# Patient Record
Sex: Male | Born: 1980 | Race: Black or African American | Hispanic: No | Marital: Single | Smoking: Current every day smoker
Health system: Southern US, Community
[De-identification: ages and names within clinical notes are randomized; demographics above are authoritative.]

## PROBLEM LIST (undated history)

## (undated) DIAGNOSIS — G43909 Migraine, unspecified, not intractable, without status migrainosus: Secondary | ICD-10-CM

## (undated) DIAGNOSIS — I10 Essential (primary) hypertension: Secondary | ICD-10-CM

## (undated) DIAGNOSIS — J45909 Unspecified asthma, uncomplicated: Secondary | ICD-10-CM

## (undated) DIAGNOSIS — Z87442 Personal history of urinary calculi: Secondary | ICD-10-CM

## (undated) DIAGNOSIS — F419 Anxiety disorder, unspecified: Secondary | ICD-10-CM

## (undated) DIAGNOSIS — Z605 Target of (perceived) adverse discrimination and persecution: Secondary | ICD-10-CM

---

## 2012-08-16 ENCOUNTER — Encounter (HOSPITAL_COMMUNITY): Payer: Self-pay | Admitting: *Deleted

## 2012-08-16 ENCOUNTER — Emergency Department (HOSPITAL_COMMUNITY)
Admission: EM | Admit: 2012-08-16 | Discharge: 2012-08-17 | Discharge status: Against Medical Advice | Payer: PRIVATE HEALTH INSURANCE | Attending: Emergency Medicine | Admitting: Emergency Medicine

## 2012-08-16 DIAGNOSIS — I10 Essential (primary) hypertension: Secondary | ICD-10-CM | POA: Insufficient documentation

## 2012-08-16 DIAGNOSIS — Z87828 Personal history of other (healed) physical injury and trauma: Secondary | ICD-10-CM | POA: Insufficient documentation

## 2012-08-16 DIAGNOSIS — M545 Low back pain, unspecified: Secondary | ICD-10-CM | POA: Insufficient documentation

## 2012-08-16 DIAGNOSIS — F17200 Nicotine dependence, unspecified, uncomplicated: Secondary | ICD-10-CM | POA: Insufficient documentation

## 2012-08-16 HISTORY — DX: Essential (primary) hypertension: I10

## 2012-08-16 HISTORY — DX: Anxiety disorder, unspecified: F41.9

## 2012-08-16 NOTE — ED Notes (Signed)
Pt called twice but no answer,  This writer also went outside to see if pt available

## 2012-08-16 NOTE — ED Notes (Signed)
Pt not in [facility] when called
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
## 2012-08-31 ENCOUNTER — Emergency Department (HOSPITAL_COMMUNITY)
Admission: EM | Admit: 2012-08-31 | Discharge: 2012-09-01 | Disposition: A | Discharge status: Home / Self Care | Payer: No Typology Code available for payment source | Attending: Emergency Medicine | Admitting: Emergency Medicine

## 2012-08-31 ENCOUNTER — Encounter (HOSPITAL_COMMUNITY): Payer: Self-pay | Admitting: Emergency Medicine

## 2012-08-31 DIAGNOSIS — R112 Nausea with vomiting, unspecified: Secondary | ICD-10-CM | POA: Insufficient documentation

## 2012-08-31 DIAGNOSIS — R42 Dizziness and giddiness: Secondary | ICD-10-CM | POA: Insufficient documentation

## 2012-08-31 DIAGNOSIS — F17200 Nicotine dependence, unspecified, uncomplicated: Secondary | ICD-10-CM | POA: Insufficient documentation

## 2012-08-31 DIAGNOSIS — G43909 Migraine, unspecified, not intractable, without status migrainosus: Secondary | ICD-10-CM | POA: Insufficient documentation

## 2012-08-31 DIAGNOSIS — Z880 Allergy status to penicillin: Secondary | ICD-10-CM | POA: Insufficient documentation

## 2012-08-31 DIAGNOSIS — I10 Essential (primary) hypertension: Secondary | ICD-10-CM | POA: Insufficient documentation

## 2012-08-31 DIAGNOSIS — H53149 Visual discomfort, unspecified: Secondary | ICD-10-CM | POA: Insufficient documentation

## 2012-08-31 DIAGNOSIS — Z8659 Personal history of other mental and behavioral disorders: Secondary | ICD-10-CM | POA: Insufficient documentation

## 2012-08-31 HISTORY — DX: Migraine, unspecified, not intractable, without status migrainosus: G43.909

## 2012-08-31 MED ORDER — KETOROLAC TROMETHAMINE 60 MG/2ML IM SOLN
60.0000 mg | Freq: Once | INTRAMUSCULAR | Status: AC
  Administered 2012-08-31: 60 mg via INTRAMUSCULAR
  Filled 2012-08-31: qty 2

## 2012-08-31 MED ORDER — HYDROMORPHONE HCL PF 1 MG/ML IJ SOLN
1.0000 mg | Freq: Once | INTRAMUSCULAR | Status: AC
  Administered 2012-08-31: 1 mg via INTRAMUSCULAR
  Filled 2012-08-31: qty 1

## 2012-08-31 MED ORDER — METOCLOPRAMIDE HCL 5 MG/ML IJ SOLN
10.0000 mg | Freq: Once | INTRAMUSCULAR | Status: AC
  Administered 2012-08-31: 10 mg via INTRAMUSCULAR
  Filled 2012-08-31: qty 2

## 2012-08-31 MED ORDER — ONDANSETRON 8 MG PO TBDP
8.0000 mg | ORAL_TABLET | Freq: Once | ORAL | Status: AC
  Administered 2012-08-31: 8 mg via ORAL
  Filled 2012-08-31: qty 1

## 2012-08-31 NOTE — ED Notes (Signed)
Pt c/o HA, dizziness, nausea, emesis x 6-7 today.

## 2012-08-31 NOTE — ED Notes (Signed)
Pt states he has been nausea, vomiting and diarrhea,  Pt is alert and oriented in NAD

## 2012-09-01 MED ORDER — ONDANSETRON 8 MG PO TBDP: 8.0000 mg | ORAL_TABLET | Freq: Three times a day (TID) | ORAL | Status: DC | PRN

## 2012-09-01 MED ORDER — NAPROXEN SODIUM 220 MG PO TABS: 220.0000 mg | ORAL_TABLET | Freq: Two times a day (BID) | ORAL | Status: DC

## 2012-09-01 NOTE — ED Provider Notes (Signed)
History    CSN: [id number] Arrival date & time 08/31/12  2230  First MD Initiated Contact with Patient 08/31/12 2259     Chief Complaint  Patient presents with  . Headache  . Dizziness    The history is provided by the patient.   patient reports a long-standing history of migraine headaches.  He reports for the past 6-7 days he's had frontal headache with photophobia and phonophobia.  He states this feels like his prior migraine headaches.  He tried over-the-counter pain medications without improvement in his symptoms.  She's had nausea and vomiting.  Denies change in his vision.  No weakness of his arms or legs.  No chest pain or shortness of breath.  No abdominal pain.  No other complaints.  He continues to smoke cigarettes.     Past Medical History  Diagnosis Date  . Anxiety   . Hypertension   . Migraine    History reviewed. No pertinent past surgical history. No family history on file. History  Substance Use Topics  . Smoking status: Current Every Day Smoker    Types: Cigarettes  . Smokeless tobacco: Not on file  . Alcohol Use: Yes     Comment: weekends    Review of Systems  Neurological: Positive for headaches.  All other systems reviewed and are negative.    Allergies  Penicillins  Home Medications   Current Outpatient Rx  Name  Route  Sig  Dispense  Refill  . Biotin 5000 MCG CAPS   Oral   Take 5,000 mcg by mouth daily.         . naproxen sodium (ANAPROX) 220 MG tablet   Oral   Take 220 mg by mouth 2 (two) times daily as needed. Head ache .         . naproxen sodium (ALEVE) 220 MG tablet   Oral   Take 1 tablet (220 mg total) by mouth 2 (two) times daily with a meal.   10 tablet   0   . ondansetron (ZOFRAN ODT) 8 MG disintegrating tablet   Oral   Take 1 tablet (8 mg total) by mouth every 8 (eight) hours as needed for nausea.   12 tablet   0    BP 151/72  Pulse 78  Temp(Src) 98.9 F (37.2 C) (Oral)  Resp 16  Ht 6' (1.829 m)  Wt 435 lb  (197.315 kg)  BMI 58.98 kg/m2  SpO2 97% Physical Exam  Nursing note and vitals reviewed. Constitutional: He is oriented to person, place, and time. He appears well-developed and well-nourished.  HENT:  Head: Normocephalic and atraumatic.  Eyes: EOM are normal. Pupils are equal, round, and reactive to light.  Neck: Normal range of motion.  Cardiovascular: Normal rate, regular rhythm, normal heart sounds and intact distal pulses.   Pulmonary/Chest: Effort normal and breath sounds normal. No respiratory distress.  Abdominal: Soft. He exhibits no distension. There is no tenderness.  Genitourinary: Rectum normal.  Musculoskeletal: Normal range of motion.  Neurological: He is alert and oriented to person, place, and time.  5/5 strength in major muscle groups of  bilateral upper and lower extremities. Speech normal. No facial asymetry.   Skin: Skin is warm and dry.  Psychiatric: He has a normal mood and affect. Judgment normal.    ED Course  Procedures (including critical care time) Labs Reviewed - No data to display No results found. 1. Migraine     MDM  Typical migraine headache for the pt.  Non focal neuro exam. No recent head trauma. No fever. Doubt meningitis. Doubt intracranial bleed. Doubt normal pressure hydrocephalus. No indication for imaging. Will treat with migraine cocktail and reevaluate  12:46 AM Feels better. Dc home  Lyanne Co, MD 09/01/12 [phone]

## 2012-12-08 ENCOUNTER — Emergency Department (HOSPITAL_COMMUNITY): Payer: Self-pay | Admitting: Anesthesiology

## 2012-12-08 ENCOUNTER — Observation Stay (HOSPITAL_COMMUNITY)
Admission: EM | Admit: 2012-12-08 | Discharge: 2012-12-09 | Disposition: A | Discharge status: Home / Self Care | Payer: Self-pay | Attending: Urology | Admitting: Urology

## 2012-12-08 ENCOUNTER — Observation Stay (HOSPITAL_COMMUNITY): Payer: PRIVATE HEALTH INSURANCE

## 2012-12-08 ENCOUNTER — Emergency Department (HOSPITAL_COMMUNITY): Payer: Self-pay

## 2012-12-08 ENCOUNTER — Encounter (HOSPITAL_COMMUNITY): Payer: Self-pay | Admitting: *Deleted

## 2012-12-08 ENCOUNTER — Encounter (HOSPITAL_COMMUNITY)
Admission: EM | Disposition: A | Discharge status: Home / Self Care | Payer: Self-pay | Source: Home / Self Care | Attending: Emergency Medicine

## 2012-12-08 ENCOUNTER — Encounter (HOSPITAL_COMMUNITY): Payer: Self-pay | Admitting: Anesthesiology

## 2012-12-08 DIAGNOSIS — N132 Hydronephrosis with renal and ureteral calculous obstruction: Secondary | ICD-10-CM

## 2012-12-08 DIAGNOSIS — N1330 Unspecified hydronephrosis: Secondary | ICD-10-CM | POA: Insufficient documentation

## 2012-12-08 DIAGNOSIS — I10 Essential (primary) hypertension: Secondary | ICD-10-CM | POA: Insufficient documentation

## 2012-12-08 DIAGNOSIS — N200 Calculus of kidney: Secondary | ICD-10-CM | POA: Insufficient documentation

## 2012-12-08 DIAGNOSIS — N201 Calculus of ureter: Principal | ICD-10-CM | POA: Insufficient documentation

## 2012-12-08 HISTORY — PX: CYSTOSCOPY W/ URETERAL STENT PLACEMENT: SHX1429

## 2012-12-08 LAB — URINALYSIS, ROUTINE W REFLEX MICROSCOPIC
Bilirubin Urine: NEGATIVE
Nitrite: NEGATIVE
Specific Gravity, Urine: 1.029 (ref 1.005–1.030)
Urobilinogen, UA: 1 mg/dL (ref 0.0–1.0)

## 2012-12-08 LAB — CBC WITH DIFFERENTIAL/PLATELET
Eosinophils Absolute: 0.1 10*3/uL (ref 0.0–0.7)
Eosinophils Relative: 2 % (ref 0–5)
Hemoglobin: 15.3 g/dL (ref 13.0–17.0)
Lymphs Abs: 0.9 10*3/uL (ref 0.7–4.0)
MCH: 28.4 pg (ref 26.0–34.0)
MCHC: 33.6 g/dL (ref 30.0–36.0)
MCV: 84.4 fL (ref 78.0–100.0)
Monocytes Absolute: 0.4 10*3/uL (ref 0.1–1.0)
Monocytes Relative: 9 % (ref 3–12)
RBC: 5.39 MIL/uL (ref 4.22–5.81)

## 2012-12-08 LAB — COMPREHENSIVE METABOLIC PANEL
Alkaline Phosphatase: 55 U/L (ref 39–117)
BUN: 8 mg/dL (ref 6–23)
Calcium: 9.6 mg/dL (ref 8.4–10.5)
Creatinine, Ser: 1.13 mg/dL (ref 0.50–1.35)
GFR calc Af Amer: >90 mL/min (ref >90)
Glucose, Bld: 112 mg/dL — ABNORMAL HIGH (ref 70–99)
Total Protein: 7.7 g/dL (ref 6.0–8.3)

## 2012-12-08 LAB — LIPASE, BLOOD: Lipase: 19 U/L (ref 11–59)

## 2012-12-08 LAB — URINE MICROSCOPIC-ADD ON

## 2012-12-08 SURGERY — CYSTOSCOPY, WITH RETROGRADE PYELOGRAM AND URETERAL STENT INSERTION
Anesthesia: General | Site: Ureter | Laterality: Bilateral | Wound class: Clean Contaminated

## 2012-12-08 MED ORDER — HYDROMORPHONE HCL PF 1 MG/ML IJ SOLN
0.5000 mg | INTRAMUSCULAR | Status: DC | PRN
  Administered 2012-12-08 (×2): 0.5 mg via INTRAVENOUS
  Filled 2012-12-08 (×2): qty 1

## 2012-12-08 MED ORDER — GENTAMICIN SULFATE 40 MG/ML IJ SOLN
630.0000 mg | Freq: Once | INTRAVENOUS | Status: AC
  Administered 2012-12-08 (×2): 630 mg via INTRAVENOUS
  Filled 2012-12-08: qty 15.75

## 2012-12-08 MED ORDER — OXYBUTYNIN CHLORIDE 5 MG PO TABS
5.0000 mg | ORAL_TABLET | Freq: Three times a day (TID) | ORAL | Status: DC | PRN
  Administered 2012-12-08: 5 mg via ORAL
  Filled 2012-12-08: qty 1

## 2012-12-08 MED ORDER — KCL IN DEXTROSE-NACL 20-5-0.45 MEQ/L-%-% IV SOLN
INTRAVENOUS | Status: DC
  Administered 2012-12-08: 20:00:00 via INTRAVENOUS
  Filled 2012-12-08 (×3): qty 1000

## 2012-12-08 MED ORDER — IOHEXOL 300 MG/ML  SOLN
INTRAMUSCULAR | Status: DC | PRN
  Administered 2012-12-08: 17 mL via INTRAVENOUS

## 2012-12-08 MED ORDER — KETOROLAC TROMETHAMINE 30 MG/ML IJ SOLN
30.0000 mg | Freq: Once | INTRAMUSCULAR | Status: AC
  Administered 2012-12-08: 30 mg via INTRAVENOUS
  Filled 2012-12-08: qty 1

## 2012-12-08 MED ORDER — FENTANYL CITRATE 0.05 MG/ML IJ SOLN: 25.0000 ug | INTRAMUSCULAR | Status: DC | PRN

## 2012-12-08 MED ORDER — DOCUSATE SODIUM 100 MG PO CAPS
100.0000 mg | ORAL_CAPSULE | Freq: Two times a day (BID) | ORAL | Status: DC
  Administered 2012-12-08 – 2012-12-09 (×2): 100 mg via ORAL
  Filled 2012-12-08 (×3): qty 1

## 2012-12-08 MED ORDER — HYDROMORPHONE HCL PF 1 MG/ML IJ SOLN
1.0000 mg | INTRAMUSCULAR | Status: DC | PRN
  Filled 2012-12-08: qty 1

## 2012-12-08 MED ORDER — SODIUM CHLORIDE 0.9 % IV SOLN
1000.0000 mL | Freq: Once | INTRAVENOUS | Status: AC
  Administered 2012-12-08: 1000 mL via INTRAVENOUS

## 2012-12-08 MED ORDER — LIDOCAINE HCL (CARDIAC) 20 MG/ML IV SOLN
INTRAVENOUS | Status: DC | PRN
  Administered 2012-12-08: 80 mg via INTRAVENOUS

## 2012-12-08 MED ORDER — FENTANYL CITRATE 0.05 MG/ML IJ SOLN
INTRAMUSCULAR | Status: DC | PRN
  Administered 2012-12-08: 50 ug via INTRAVENOUS

## 2012-12-08 MED ORDER — ONDANSETRON HCL 4 MG/2ML IJ SOLN
4.0000 mg | Freq: Once | INTRAMUSCULAR | Status: AC
  Administered 2012-12-08: 4 mg via INTRAVENOUS
  Filled 2012-12-08: qty 2

## 2012-12-08 MED ORDER — OXYCODONE-ACETAMINOPHEN 5-325 MG PO TABS: 1.0000 | ORAL_TABLET | ORAL | Status: DC | PRN

## 2012-12-08 MED ORDER — PROPOFOL 10 MG/ML IV BOLUS
INTRAVENOUS | Status: DC | PRN
  Administered 2012-12-08 (×2): 75 mg via INTRAVENOUS
  Administered 2012-12-08: 250 mg via INTRAVENOUS

## 2012-12-08 MED ORDER — SODIUM CHLORIDE 0.9 % IV SOLN
1000.0000 mL | INTRAVENOUS | Status: DC
  Administered 2012-12-08: 1000 mL via INTRAVENOUS
  Administered 2012-12-08: 18:00:00 via INTRAVENOUS

## 2012-12-08 MED ORDER — SENNA 8.6 MG PO TABS
1.0000 | ORAL_TABLET | Freq: Two times a day (BID) | ORAL | Status: DC
  Administered 2012-12-08 – 2012-12-09 (×2): 8.6 mg via ORAL
  Filled 2012-12-08 (×2): qty 1

## 2012-12-08 MED ORDER — 0.9 % SODIUM CHLORIDE (POUR BTL) OPTIME
TOPICAL | Status: DC | PRN
  Administered 2012-12-08: 1000 mL

## 2012-12-08 MED ORDER — HYDROMORPHONE HCL PF 1 MG/ML IJ SOLN
0.5000 mg | INTRAMUSCULAR | Status: DC | PRN
  Administered 2012-12-08 – 2012-12-09 (×2): 1 mg via INTRAVENOUS
  Filled 2012-12-08 (×2): qty 1

## 2012-12-08 MED ORDER — ACETAMINOPHEN 500 MG PO TABS
1000.0000 mg | ORAL_TABLET | Freq: Four times a day (QID) | ORAL | Status: DC
  Administered 2012-12-09 (×2): 1000 mg via ORAL
  Filled 2012-12-08 (×3): qty 2

## 2012-12-08 MED ORDER — ONDANSETRON HCL 4 MG/2ML IJ SOLN
INTRAMUSCULAR | Status: DC | PRN
  Administered 2012-12-08: 4 mg via INTRAVENOUS

## 2012-12-08 MED ORDER — DEXAMETHASONE SODIUM PHOSPHATE 10 MG/ML IJ SOLN
INTRAMUSCULAR | Status: DC | PRN
  Administered 2012-12-08: 10 mg via INTRAVENOUS

## 2012-12-08 MED ORDER — ONDANSETRON HCL 4 MG/2ML IJ SOLN
4.0000 mg | INTRAMUSCULAR | Status: DC | PRN
  Administered 2012-12-08 – 2012-12-09 (×2): 4 mg via INTRAVENOUS
  Filled 2012-12-08 (×2): qty 2

## 2012-12-08 MED ORDER — SODIUM CHLORIDE 0.9 % IR SOLN
Status: DC | PRN
  Administered 2012-12-08: 3000 mL

## 2012-12-08 SURGICAL SUPPLY — 8 items
CLOTH BEACON ORANGE TIMEOUT ST (SAFETY) ×3 IMPLANT
DRAPE CAMERA CLOSED 9X96 (DRAPES) ×3 IMPLANT
GOWN STRL REIN XL XLG (GOWN DISPOSABLE) ×6 IMPLANT
GUIDEWIRE STR DUAL SENSOR (WIRE) ×3 IMPLANT
PACK CYSTO (CUSTOM PROCEDURE TRAY) ×3 IMPLANT
SHEATH ACCESS URETERAL 38CM (SHEATH) ×3 IMPLANT
STENT POLARIS 5FRX26 (STENTS) ×6 IMPLANT
TUBE FEEDING 8FR 16IN STR KANG (MISCELLANEOUS) ×3 IMPLANT

## 2012-12-08 NOTE — Anesthesia Preprocedure Evaluation (Addendum)
Anesthesia Evaluation  Patient identified by MRN, date of birth, ID band Patient awake    Reviewed: Allergy & Precautions, H&P , Patient's Chart, lab work & pertinent test results, reviewed documented beta blocker date and time   Airway Mallampati: IV TM Distance: >3 FB Neck ROM: full   Comment: Opens well, Midline trach, plenty thyromental distance. Dental no notable dental hx.    Pulmonary  breath sounds clear to auscultation  Pulmonary exam normal       Cardiovascular hypertension (No current meds, not greatly increased today), Rhythm:regular Rate:Normal     Neuro/Psych  Headaches,    GI/Hepatic   Endo/Other  Morbid obesity  Renal/GU      Musculoskeletal   Abdominal   Peds  Hematology   Anesthesia Other Findings   Reproductive/Obstetrics                           Anesthesia Physical Anesthesia Plan  ASA: III  Anesthesia Plan:    Post-op Pain Management:    Induction: Intravenous  Airway Management Planned: LMA  Additional Equipment:   Intra-op Plan:   Post-operative Plan:   Informed Consent: I have reviewed the patients History and Physical, chart, labs and discussed the procedure including the risks, benefits and alternatives for the proposed anesthesia with the patient or authorized representative who has indicated his/her understanding and acceptance.   Dental Advisory Given and Dental advisory given  Plan Discussed with: CRNA and Surgeon  Anesthesia Plan Comments: (Discussed with Phillips Grout. Should be able to use #5 LMA; Pre-o2 and glide for backup. Est 420lbs per pt. NPO since yesterday confirmed)       Anesthesia Quick Evaluation

## 2012-12-08 NOTE — ED Notes (Signed)
Pt given urinal.

## 2012-12-08 NOTE — Transfer of Care (Signed)
Immediate Anesthesia Transfer of Care Note  Patient: Keith Crane  Procedure(s) Performed: Procedure(s): CYSTOSCOPY WITH RETROGRADE PYELOGRAM/URETERAL STENT PLACEMENT, left diagnostic ureteroscopy (Bilateral)  Patient Location: PACU  Anesthesia Type:General  Level of Consciousness: awake, oriented, pateint uncooperative and responds to stimulation  Airway & Oxygen Therapy: Patient Spontanous Breathing and Patient connected to face mask oxygen  Post-op Assessment: Report given to PACU RN, Post -op Vital signs reviewed and stable and Patient moving all extremities  Post vital signs: Reviewed stable  Complications: No apparent anesthesia complications

## 2012-12-08 NOTE — ED Notes (Signed)
Patient has been to the restroom 2 times and states he cannot go until he gets pain meds.

## 2012-12-08 NOTE — Progress Notes (Signed)
Pt sitting on commode in bathroom since arrival to 1405 (at 1820).  C/o constipation and abdominal cramping.  States he normally moves his bowels daily and has not yet moved them today.  Educated pt regarding effects of anesthesia and pain meds on bowels post op, pt verbalized understanding.  Requesting laxative, stool softener and pain meds be given now; given to patient. Pt also appears nauseated, retching over trash can while seated on commode.  Zofran given per orders.  After discussion with pt regarding possible bladder spasms, oxybutinin given.  Pt assisted to reposition in bed, bed alarm on.  Will continue to monitor.  Ardyth Gal, RN 12/08/2012   1855:  Pt resting comfortably in bed, asleep.  Will monitor.  Ardyth Gal, RN 12/08/2012

## 2012-12-08 NOTE — H&P (Signed)
Subjective:  1 - Left > Rt Nephrolithiasis with Refractory Flank Pain - Pt with first episode renal colic x 24 hrs with left 10mm UPJ stone (mod hydro) and rt 3mm mid stone (non-obstructing) by ER CT. No fevers or infectious parameters on UA and bloodwork. He has received IV pain and nausea meds x several in ER but pain not controllable.  PMH sig for morbid obesity. No CV disease. No blood thinners. No prior surgery.   Objective:  Vital signs in last 24 hours:  Temp: [97.3 F (36.3 C)-98.6 F (37 C)] 97.3 F (36.3 C) (10/05 1224)  Pulse Rate: [62-64] 64 (10/05 1415)  Resp: [16-20] 16 (10/05 1415)  BP: (146-178)/(68-85) 178/85 mmHg (10/05 1224)  SpO2: [99 %-100 %] 100 % (10/05 1415)   Intake/Output from previous day:   Intake/Output this shift:   General appearance: alert, cooperative, appears stated age and In visible discomfort  Head: Normocephalic, without obvious abnormality, atraumatic  Eyes: conjunctivae/corneas clear. PERRL, EOM's intact. Fundi benign.  Ears: normal TM's and external ear canals both ears  Nose: Nares normal. Septum midline. Mucosa normal. No drainage or sinus tenderness.  Throat: lips, mucosa, and tongue normal; teeth and gums normal  Neck: no adenopathy, no carotid bruit, no JVD, supple, symmetrical, trachea midline and thyroid not enlarged, symmetric, no tenderness/mass/nodules  Back: symmetric, no curvature. ROM normal. No CVA tenderness.  Resp: clear to auscultation bilaterally  Chest wall: no tenderness  Cardio: regular rate and rhythm, S1, S2 normal, no murmur, click, rub or gallop  GI: soft, non-tender; bowel sounds normal; no masses, no organomegaly  Male genitalia: buried penis visible only after reduciton of mons fat pad. Moderate Left CVAT. Extremities: extremities normal, atraumatic, no cyanosis or edema  Pulses: 2+ and symmetric  Skin: Skin color, texture, turgor normal. No rashes or lesions  Lymph nodes: Cervical, supraclavicular, and axillary  nodes normal.  Neurologic: Grossly normal  Lab Results:   Recent Labs   12/08/12 0927   WBC  4.7   HGB  15.3   HCT  45.5   PLT  236    BMET   Recent Labs   12/08/12 0927   NA  138   K  3.4*   CL  102   CO2  24   GLUCOSE  112*   BUN  8   CREATININE  1.13   CALCIUM  9.6    PT/INR  No results found for this basename: LABPROT, INR, in the last 72 hours  ABG  No results found for this basename: PHART, PCO2, PO2, HCO3, in the last 72 hours  Studies/Results:  Ct Abdomen Pelvis Wo Contrast  12/08/2012 CLINICAL DATA: Left-sided flank pain, abdominal pain, back pain, nausea and vomiting. EXAM: CT ABDOMEN AND PELVIS WITHOUT TECHNIQUE: Multidetector CT imaging of the chest, abdomen and pelvis was performed following the standard protocol without IV contrast. COMPARISON: None. FINDINGS: There is evidence of moderate left hydronephrosis secondary to a calculus at the left ureteropelvic junction. The calculus measures proximally 10 mm in greatest diameter. No other left-sided calculi are identified. There is a 3 mm nonobstructing calculus in the lower pole of the right kidney. The bladder is decompressed and unremarkable in appearance. Unenhanced appearance of the liver, gallbladder, pancreas, spleen, adrenal glands and bowel are unremarkable. No abnormal fluid collections are identified. No hernias are seen. No incidental masses or enlarged lymph nodes. Bony structures are unremarkable. IMPRESSION: Left hydronephrosis secondary to an obstructing 10 mm calculus located at the ureteropelvic junction.  Electronically Signed By: Irish Lack M.D. On: 12/08/2012 12:49   Anti-infectives:  Anti-infectives    None      Assessment/Plan:  1 - Left > Rt Nephrolithiasis with Refractory Flank Pain - left sided obstructing stone statistically unlikely to pass with medical therapy. His symptoms are refractory to conservative measures including PO and IV pain meds.   Discussed treatments including  shockwave lithotripsy and ureteroscopy with goals of treating obstructing stone vs treating both sides for goal of stone free. Lithotripsy not first choice given his morbid obesity, stone >62mm, and multifocally. Pt wants to proceed with bilateral ureteroscopic stone manipulation today. Will attempt to address both sides at same time as long as feasible. I specifically addressed possibility of staged approach with stenting today and definitive treatment later if ureteral anatomy unfavorable.   Risks including bleeding, infection, damage to kidney / ureter / bladder / urethra, non-cures as well as rare risks such as DVT, PE, MI, CVA, Loss of Kidney, Mortality also discussed.   LOS: 0 days  Nayra Coury

## 2012-12-08 NOTE — Progress Notes (Signed)
ANTIBIOTIC CONSULT NOTE  Pharmacy Consult for Gentamicin Indication: peri-op prophylaxis  Allergies  Allergen Reactions  . Penicillins Anaphylaxis    Patient Measurements:   Height: 72" (08/31/12) Weight: 197 kg (08/31/12)   Vital Signs: Temp: 98.9 F (37.2 C) (10/05 1535) Temp src: Oral (10/05 1535) BP: 153/75 mmHg (10/05 1535) Pulse Rate: 58 (10/05 1535) Intake/Output from previous day:   Intake/Output from this shift:    Labs:  Recent Labs  12/08/12 0927  WBC 4.7  HGB 15.3  PLT 236  CREATININE 1.13   The CrCl is unknown because both a height and weight (above a minimum accepted value) are required for this calculation. No results found for this basename: VANCOTROUGH, VANCOPEAK, VANCORANDOM, GENTTROUGH, GENTPEAK, GENTRANDOM, TOBRATROUGH, TOBRAPEAK, TOBRARND, AMIKACINPEAK, AMIKACINTROU, AMIKACIN,  in the last 72 hours   Microbiology: No results found for this or any previous visit (from the past 720 hour(s)).  Medical History: Past Medical History  Diagnosis Date  . Anxiety   . Hypertension   . Migraine      Assessment: 53 yoM with Left > Rt Nephrolithiasis requiring bilateral ureteroscopic stone manipulation today.  Pharmacy consulted to dose gentamicin pre-op.  At time of consult, patient already been transferred to OR.   IBW = 77.6 kg Adjusted body weight = 125 mg SCr 1.13  Goal of Therapy:  peri-op prophylaxis  Plan:  Gentamicin 630 mg (5 mg/kg of adjusted body weight) now.  Clance Boll 12/08/2012,4:03 PM

## 2012-12-08 NOTE — ED Provider Notes (Signed)
CSN: [id number]     Arrival date & time 12/08/12  0902 History   First MD Initiated Contact with Patient 12/08/12 [phone]     Chief Complaint  Patient presents with  . Emesis    Patient is a 32 y.o. male presenting with abdominal pain. The history is provided by the patient.  Abdominal Pain Pain location:  LLQ and RLQ Pain quality: sharp   Pain radiates to:  Back Pain severity:  Severe Onset quality:  Sudden Timing:  Constant Chronicity:  New Context: not diet changes, not eating and not recent illness   Relieved by:  Nothing Worsened by:  Nothing tried Associated symptoms: anorexia, constipation and vomiting   Associated symptoms: no dysuria and no fever     Past Medical History  Diagnosis Date  . Anxiety   . Hypertension   . Migraine    History reviewed. No pertinent past surgical history. No family history on file. History  Substance Use Topics  . Smoking status: Current Every Day Smoker    Types: Cigarettes  . Smokeless tobacco: Not on file  . Alcohol Use: Yes     Comment: weekends    Review of Systems  Constitutional: Negative for fever.  Gastrointestinal: Positive for vomiting, abdominal pain, constipation and anorexia.  Genitourinary: Negative for dysuria.  All other systems reviewed and are negative.    Allergies  Penicillins  Home Medications  No current outpatient prescriptions on file. BP 178/85  Pulse 62  Temp(Src) 97.3 F (36.3 C) (Oral)  Resp 16  SpO2 99% Physical Exam  Nursing note and vitals reviewed. Constitutional: He appears distressed.  Morbidly obese  HENT:  Head: Normocephalic and atraumatic.  Right Ear: External ear normal.  Left Ear: External ear normal.  Eyes: Conjunctivae are normal. Right eye exhibits no discharge. Left eye exhibits no discharge. No scleral icterus.  Neck: Neck supple. No tracheal deviation present.  Cardiovascular: Normal rate, regular rhythm and intact distal pulses.   Pulmonary/Chest: Effort normal and  breath sounds normal. No stridor. No respiratory distress. He has no wheezes. He has no rales.  Abdominal: Soft. Bowel sounds are normal. He exhibits no distension. There is generalized tenderness. There is no rebound and no guarding. No hernia.  Musculoskeletal: He exhibits no edema and no tenderness.  Neurological: He is alert. He has normal strength. No sensory deficit. Cranial nerve deficit:  no gross defecits noted. He exhibits normal muscle tone. He displays no seizure activity. Coordination normal.  Skin: Skin is warm and dry. No rash noted.  Psychiatric: He has a normal mood and affect.    ED Course  Procedures (including critical care time) Labs Review Labs Reviewed  COMPREHENSIVE METABOLIC PANEL - Abnormal; Notable for the following:    Potassium 3.4 (*)    Glucose, Bld 112 (*)    GFR calc non Af Amer 87 (*)    All other components within normal limits  URINALYSIS, ROUTINE W REFLEX MICROSCOPIC - Abnormal; Notable for the following:    Hgb urine dipstick LARGE (*)    Ketones, ur 40 (*)    Protein, ur 30 (*)    All other components within normal limits  CBC WITH DIFFERENTIAL  LIPASE, BLOOD  URINE MICROSCOPIC-ADD ON   Imaging Review Ct Abdomen Pelvis Wo Contrast  12/08/2012   CLINICAL DATA:  Left-sided flank pain, abdominal pain, back pain, nausea and vomiting.  EXAM: CT ABDOMEN AND PELVIS WITHOUT  TECHNIQUE: Multidetector CT imaging of the chest, abdomen and pelvis was performed  following the standard protocol without IV contrast.  COMPARISON:  None.  FINDINGS: There is evidence of moderate left hydronephrosis secondary to a calculus at the left ureteropelvic junction. The calculus measures proximally 10 mm in greatest diameter. No other left-sided calculi are identified. There is a 3 mm nonobstructing calculus in the lower pole of the right kidney. The bladder is decompressed and unremarkable in appearance.  Unenhanced appearance of the liver, gallbladder, pancreas, spleen,  adrenal glands and bowel are unremarkable. No abnormal fluid collections are identified. No hernias are seen. No incidental masses or enlarged lymph nodes. Bony structures are unremarkable.  IMPRESSION: Left hydronephrosis secondary to an obstructing 10 mm calculus located at the ureteropelvic junction.   Electronically Signed   By: Irish Lack M.D.   On: 12/08/2012 12:49   Medications  0.9 %  sodium chloride infusion (0 mLs Intravenous Stopped 12/08/12 1110)    Followed by  0.9 %  sodium chloride infusion (1,000 mLs Intravenous New Bag/Given 12/08/12 1115)  HYDROmorphone (DILAUDID) injection 1 mg (not administered)  ketorolac (TORADOL) 30 MG/ML injection 30 mg (not administered)  ondansetron (ZOFRAN) injection 4 mg (4 mg Intravenous Given 12/08/12 0947)   1312  Pt still having pain.  Will give additional dilaudid and toradol   MDM   1. Ureteral stone with hydronephrosis    CT scan does demonstrate a large 10 mm stone.  Pt still having pain in the ED.  Consulted with Dr Berneice Heinrich. He will evaluate patient in the ED to discuss further treatment options.  Will continue to try and manage his pain.    Celene Kras, MD 12/08/12 [phone]

## 2012-12-08 NOTE — Op Note (Signed)
Keith Crane, Keith Crane NO.:  [id number]  MEDICAL RECORD NO.:  [id number]  LOCATION:  1405                         FACILITY:  [facility]  PHYSICIAN:  Keith Ache, MD     DATE OF BIRTH:  [birth year]  DATE OF PROCEDURE: 12/08/2012 DATE OF DISCHARGE:                              OPERATIVE REPORT   PREOPERATIVE DIAGNOSIS:  Left ureteral stone, refractory flank pain, right renal stone.  PROCEDURE: 1. Cystoscopy with bilateral retrograde pyelogram and interpretation. 2. Insertion of bilateral ureteral stents, 5 x 26 Polaris, no tether. 3. Left diagnostic ureteroscopy.  FINDINGS: 1. Left mild hydronephrosis and filling defect at the area of the UPJ     consistent with known left proximal ureteral stone. 2. Unremarkable right retrograde pyelogram. 3. Unremarkable urinary bladder. 4. Buried penis. 5. Relative narrowing at the area of the left iliac vessels with     inability to pass ureteroscope or sheath proximal to this.  ESTIMATED BLOOD LOSS:  Nil.  SPECIMENS:  None.  INDICATION:  Mr. Keith Crane is a pleasant 32 year old gentleman without history of prior nephrolithiasis who presented with a 1 day prodrome of colicky left flank pain and nausea.  He underwent evaluation in the [facility] ER.  He was given initial conservative trial of medical therapy in the emergency room, however, his pain and nausea remained refractory.  Options were discussed including and continued medical therapy versus ureteral stenting versus plan for definitive stone treatment today, and he was received the latter for treatment of bilateral stones.  We discussed the distinct possibility that he may require staged approach given his male sex and young age and to have a relatively non-capacious ureters.  He voiced understanding.  Informed consents was obtained and placed in the medical record.  PROCEDURE IN DETAIL:  The patient being Keith Crane, procedure being bilateral ureteroscopic  simulation was confirmed.  Procedures carried out.  Time-out was performed.  Intravenous antibiotics were administered.  General LMA anesthesia was introduced.  The patient placed into the low lithotomy position and sterile field was created by prepping and draping the patient's penis, perineum, and proximal thighs using iodine x3.  Notably, the patient has a significantly buried penis due to his morbid obesity.  Next, cystourethroscopy was performed using 22-French rigid scope with 12-degree offset lens.  Inspection of the anterior and posterior urethra unremarkable.  Inspection of bladder revealed no diverticula, calcifications, papillary lesions.  Ureteral orifices were in the normal anatomic position.  The left ureter was cannulated with a 6-French end-hole catheter, and left retrograde pyelogram was obtained.  The left retrograde pyelogram demonstrated a single left ureter, single system left kidney.  There was relative narrowing at multiple points consistent with likely ureteral peristalsis.  This was corroborated on several spot images.  There was mild hydronephrosis without ureteral nephrosis.  A filling defect in the proximal ureter consistent with known left UPJ stone.  A 0.038 Glidewire was advanced at the level of the upper pole and set aside as a safety wire.  Next, semi-rigid ureteroscopy was performed of the distal most left ureter alongside a separate Sensor working wire and an 8-French feeding tube in urinary bladder for pressure release.  Inspection of  the intramural ureter was unremarkable.  At the area of the presumed iliac crossing, there was relative narrowing without obvious local stricture even using a train track technique.  The semi-rigid ureteroscope was not able to easily navigate this area.  This was also felt to be partially due to the patient's morbid obesity, which made angulation quite difficult in this location.  As we had this navigated into the renal  ureter, it was felt that a single trial of passage of ureteral access sheath was warranted. As such the 38 cm 12/14 ureteral access sheath was carefully navigated of the distal ureter over the sensor working wire using continuous fluoroscopic guidance, and the aforementioned area of the presumed iliac crossing.  There was relative increased resistance noted.  It was felt that this too would not successfully navigate the area.  Given these findings, it was felt that a staged approach was stenting today to allow for passive ureteral dilation would be the most prudent way to proceed. As such, using cystoscopic guidance, a new 5 x 26 Polaris stent was carefully placed over the safety wire using cystoscopic and fluoroscopic guidance.  Good proximal and distal deployment were noted.  Efflux of urine was seen around and through the distal end of the stent. Attention was directed to the right side.  The right ureteral orifice was cannulated with 6-French end-hole catheter, and right retrograde was obtained.  Right retrograde pyelogram demonstrated a single right ureter, single system, right kidney.  No filling defects or narrowing noted.  A 0.038 Glidewire was advanced at the level of the upper pole and set aside as a working wire.  Next, a 5 x 26 Polaris type stent was carefully navigated to the level of the renal pelvis.  Good proximal and distal deployment were noted.  Efflux of urine was seen around into the distal end of the right stent.  This was placed on this side to allow for passive ureteral dilation.  As the patient's ultimate goal was stone free status beside. Bladder was emptied per cystoscope.  Procedure was then terminated.  The patient tolerated the procedure well.  There were no immediate periprocedural complications.  The patient was taken to postanesthesia care unit in stable condition.  Notably, the patient will be admitted overnight for observation as he has awarded a stay  tonight.          ______________________________ Keith Ache, MD     TM/MEDQ  D:  12/08/2012  T:  12/08/2012  Job:  [id number]

## 2012-12-08 NOTE — ED Notes (Signed)
Pt brought to restroom to try to move bowels

## 2012-12-08 NOTE — Brief Op Note (Signed)
12/08/2012  5:25 PM  PATIENT:  Keith Crane  32 y.o. male  PRE-OPERATIVE DIAGNOSIS:  bilateral ureteral stones  POST-OPERATIVE DIAGNOSIS:  bilateral ureteral stones  PROCEDURE:  Procedure(s): CYSTOSCOPY WITH RETROGRADE PYELOGRAM/URETERAL STENT PLACEMENT, left diagnostic ureteroscopy (Bilateral)  SURGEON:  Surgeon(s) and Role:    * Sebastian Ache, MD - Primary  PHYSICIAN ASSISTANT:   ASSISTANTS: none   ANESTHESIA:   general  EBL:  Total I/O In: 100 [IV Piggyback:100] Out: -   BLOOD ADMINISTERED:none  DRAINS: none   LOCAL MEDICATIONS USED:  NONE  SPECIMEN:  No Specimen  DISPOSITION OF SPECIMEN:  N/A  COUNTS:  YES  TOURNIQUET:  * No tourniquets in log *  DICTATION: .Other Dictation: Dictation Number [id number]  PLAN OF CARE: Admit for overnight observation  PATIENT DISPOSITION:  PACU - hemodynamically stable.   Delay start of Pharmacological VTE agent (>24hrs) due to surgical blood loss or risk of bleeding: not applicable

## 2012-12-08 NOTE — ED Notes (Signed)
Patient has had CHG wipe down/bath

## 2012-12-08 NOTE — Anesthesia Postprocedure Evaluation (Signed)
  Anesthesia Post-op Note  Patient: Keith Crane  Procedure(s) Performed: Procedure(s): CYSTOSCOPY WITH RETROGRADE PYELOGRAM/URETERAL STENT PLACEMENT, left diagnostic ureteroscopy (Bilateral) Patient is awake and responsive. Pain and nausea are reasonably well controlled. Vital signs are stable and clinically acceptable. Oxygen saturation is clinically acceptable. There are no apparent anesthetic complications at this time. Patient is ready for discharge.

## 2012-12-08 NOTE — Anesthesia Procedure Notes (Addendum)
Procedure Name: LMA Insertion Date/Time: 12/08/2012 4:43 PM Performed by: Edison Pace Pre-anesthesia Checklist: Patient identified, Emergency Drugs available, Suction available, Patient being monitored and Timeout performed Patient Re-evaluated:Patient Re-evaluated prior to inductionOxygen Delivery Method: Circle system utilized Preoxygenation: Pre-oxygenation with 100% oxygen Intubation Type: IV induction Ventilation: Mask ventilation without difficulty LMA: LMA with gastric port inserted LMA Size: 5.0 Number of attempts: 1 Intubation method: simulated wedge pillow with blankets, pillows. Placement Confirmation: positive ETCO2 Tube secured with: Tape Dental Injury: Teeth and Oropharynx as per pre-operative assessment  Difficulty Due To: Difficulty was anticipated, Difficult Airway- due to large tongue and Difficult Airway- due to reduced neck mobility Future Recommendations: Recommend- induction with short-acting agent, and alternative techniques readily available

## 2012-12-08 NOTE — ED Notes (Signed)
Pt actively vomiting in ED

## 2012-12-08 NOTE — ED Notes (Signed)
Pt reports n/v and constipation that began this morning. C/o abd pain and back pain.

## 2012-12-08 NOTE — Progress Notes (Signed)
Subjective:  1 - Left > Rt Nephrolithiasis with Refractory Flank Pain - Pt with first episode renal colic x 24 hrs with left 10mm UPJ stone (mod hydro) and rt 3mm mid stone (non-obstructing) by ER CT. No fevers or infectious parameters on UA and bloodwork. He has received IV pain and nausea meds x several in ER but pain not controllable.   PMH sig for morbid obesity. No CV disease. No blood thinners. No prior surgery.   Objective: Vital signs in last 24 hours: Temp:  [97.3 F (36.3 C)-98.6 F (37 C)] 97.3 F (36.3 C) (10/05 1224) Pulse Rate:  [62-64] 64 (10/05 1415) Resp:  [16-20] 16 (10/05 1415) BP: (146-178)/(68-85) 178/85 mmHg (10/05 1224) SpO2:  [99 %-100 %] 100 % (10/05 1415)    Intake/Output from previous day:   Intake/Output this shift:    General appearance: alert, cooperative, appears stated age and In visible discomfort Head: Normocephalic, without obvious abnormality, atraumatic Eyes: conjunctivae/corneas clear. PERRL, EOM's intact. Fundi benign. Ears: normal TM's and external ear canals both ears Nose: Nares normal. Septum midline. Mucosa normal. No drainage or sinus tenderness. Throat: lips, mucosa, and tongue normal; teeth and gums normal Neck: no adenopathy, no carotid bruit, no JVD, supple, symmetrical, trachea midline and thyroid not enlarged, symmetric, no tenderness/mass/nodules Back: symmetric, no curvature. ROM normal. No CVA tenderness. Resp: clear to auscultation bilaterally Chest wall: no tenderness Cardio: regular rate and rhythm, S1, S2 normal, no murmur, click, rub or gallop GI: soft, non-tender; bowel sounds normal; no masses,  no organomegaly Male genitalia: buried penis visible only after reduciton of mons fat pad Extremities: extremities normal, atraumatic, no cyanosis or edema Pulses: 2+ and symmetric Skin: Skin color, texture, turgor normal. No rashes or lesions Lymph nodes: Cervical, supraclavicular, and axillary nodes normal. Neurologic:  Grossly normal  Lab Results:   Recent Labs  12/08/12 0927  WBC 4.7  HGB 15.3  HCT 45.5  PLT 236   BMET  Recent Labs  12/08/12 0927  NA 138  K 3.4*  CL 102  CO2 24  GLUCOSE 112*  BUN 8  CREATININE 1.13  CALCIUM 9.6   PT/INR No results found for this basename: LABPROT, INR,  in the last 72 hours ABG No results found for this basename: PHART, PCO2, PO2, HCO3,  in the last 72 hours  Studies/Results: Ct Abdomen Pelvis Wo Contrast  12/08/2012   CLINICAL DATA:  Left-sided flank pain, abdominal pain, back pain, nausea and vomiting.  EXAM: CT ABDOMEN AND PELVIS WITHOUT  TECHNIQUE: Multidetector CT imaging of the chest, abdomen and pelvis was performed following the standard protocol without IV contrast.  COMPARISON:  None.  FINDINGS: There is evidence of moderate left hydronephrosis secondary to a calculus at the left ureteropelvic junction. The calculus measures proximally 10 mm in greatest diameter. No other left-sided calculi are identified. There is a 3 mm nonobstructing calculus in the lower pole of the right kidney. The bladder is decompressed and unremarkable in appearance.  Unenhanced appearance of the liver, gallbladder, pancreas, spleen, adrenal glands and bowel are unremarkable. No abnormal fluid collections are identified. No hernias are seen. No incidental masses or enlarged lymph nodes. Bony structures are unremarkable.  IMPRESSION: Left hydronephrosis secondary to an obstructing 10 mm calculus located at the ureteropelvic junction.   Electronically Signed   By: Irish Lack M.D.   On: 12/08/2012 12:49    Anti-infectives: Anti-infectives   None      Assessment/Plan:  1 - Left > Rt Nephrolithiasis with  Refractory Flank Pain - left sided obstructing stone statistically unlikely to pass with medical therapy. Discussed treatments including shockwave lithotripsy and ureteroscopy with goals of treating obstructing stone vs treating both sides for goal of stone free.  Lithotripsy not first choice given his morbid obesity, stone >39mm, and multifocally. Pt wants to proceed with bilateral ureteroscopic stone manipulation today.  Will attempt to address both sides at same time as long as feasible. I specifically addressed possibility of staged approach with stenting today and definitive treatment later if ureteral anatomy unfavorable.  Risks including bleeding, infection, damage to kidney / ureter / bladder / urethra, non-cures as well as rare risks such as DVT, PE, MI, CVA, Loss of Kidney, Mortality also discussed.    LOS: 0 days    Natanel Snavely 12/08/2012

## 2012-12-09 ENCOUNTER — Encounter (HOSPITAL_COMMUNITY): Payer: Self-pay | Admitting: Urology

## 2012-12-09 ENCOUNTER — Other Ambulatory Visit: Payer: Self-pay | Admitting: Urology

## 2012-12-09 MED ORDER — SENNOSIDES-DOCUSATE SODIUM 8.6-50 MG PO TABS: 1.0000 | ORAL_TABLET | Freq: Two times a day (BID) | ORAL | Status: DC

## 2012-12-09 MED ORDER — OXYCODONE-ACETAMINOPHEN 5-325 MG PO TABS: 1.0000 | ORAL_TABLET | ORAL | Status: DC | PRN

## 2012-12-09 NOTE — Discharge Summary (Signed)
Physician Discharge Summary  Patient ID: Keith Crane MRN: [id number] DOB/AGE: 1980/04/17 32 y.o.  Admit date: 12/08/2012 Discharge date: 12/09/2012  Admission Diagnoses: Bilateral Kidney Stones, Refractory Flank Pain  Discharge Diagnoses: Bilateral Kidney Stones, Refractory Flank Pain s/p Ureteral Stenting, Improved   Discharged Condition: good  Hospital Course:   1 - Left > Rt Nephrolithiasis with Refractory Flank Pain - Pt with first episode renal colic  with left 10mm UPJ stone (mod hydro) and rt 3mm mid stone (non-obstructing) by ER CT 12/08/12.  Symptoms refractory. Underwent cysto bilateral retrogrades and bilateral ureteral stenting 10/5 to allow for passive dilation and symptom control with plan for more definitive bilateral ureteroscopic stone manipulation in several weeks. This was attempted on 10/5, but due to relatively narrow ureters was not possible at that time.    Consults: None  Significant Diagnostic Studies: radiology: CT, Retrograde pyelograms  Treatments: surgery: cysto bilateral retrogrades and bilateral ureteral stenting 10/5   Discharge Exam: Blood pressure 142/66, pulse 47, temperature 98.1 F (36.7 C), temperature source Oral, resp. rate 16, weight 197.315 kg (435 lb), SpO2 100.00%. General appearance: alert, cooperative, appears stated age and morbidly obese Head: Normocephalic, without obvious abnormality, atraumatic Eyes: conjunctivae/corneas clear. PERRL, EOM's intact. Fundi benign. Ears: normal TM's and external ear canals both ears Nose: Nares normal. Septum midline. Mucosa normal. No drainage or sinus tenderness. Throat: lips, mucosa, and tongue normal; teeth and gums normal Neck: no adenopathy, no carotid bruit, no JVD, supple, symmetrical, trachea midline and thyroid not enlarged, symmetric, no tenderness/mass/nodules Back: symmetric, no curvature. ROM normal. No CVA tenderness. Resp: clear to auscultation bilaterally Chest wall: no  tenderness Cardio: regular rate and rhythm, S1, S2 normal, no murmur, click, rub or gallop GI: soft, non-tender; bowel sounds normal; no masses,  no organomegaly Male genitalia: normal Extremities: extremities normal, atraumatic, no cyanosis or edema Pulses: 2+ and symmetric Skin: Skin color, texture, turgor normal. No rashes or lesions Lymph nodes: Cervical, supraclavicular, and axillary nodes normal. Neurologic: Grossly normal  Disposition: 01-Home or Self Care     Medication List         oxyCODONE-acetaminophen 5-325 MG per tablet  Commonly known as:  PERCOCET/ROXICET  Take 1 tablet by mouth every 4 (four) hours as needed. For post-op pain     senna-docusate 8.6-50 MG per tablet  Commonly known as:  Senokot-S  Take 1 tablet by mouth 2 (two) times daily. While taking pain meds to prevent constipation.           Follow-up Information   Follow up with Sebastian Ache, MD. (We will contact you with date of next stage surgery in about 2-3 weeks)    Specialty:  Urology   Contact information:   [address] [place] [address] [phone]       Signed: Sebastian Ache 12/09/2012, 7:05 AM

## 2012-12-09 NOTE — Care Management Note (Signed)
    Page 1 of 1   12/09/2012     11:32:38 AM   CARE MANAGEMENT NOTE 12/09/2012  Patient:  Keith Crane, Keith Crane   Account Number:  [id number]  Date Initiated:  12/09/2012  Documentation initiated by:  Lanier Clam  Subjective/Objective Assessment:   31 Y/OM ADMITTED W/URETERAL STONE.     Action/Plan:   FROM HOME.   Anticipated DC Date:  12/09/2012   Anticipated DC Plan:  HOME/SELF CARE      DC Planning Services  CM consult      Choice offered to / List presented to:             Status of service:  Completed, signed off Medicare Important Message given?   (If response is "NO", the following Medicare IM given date fields will be blank) Date Medicare IM given:   Date Additional Medicare IM given:    Discharge Disposition:  HOME/SELF CARE  Per UR Regulation:  Reviewed for med. necessity/level of care/duration of stay  If discussed at Long Length of Stay Meetings, dates discussed:    Comments:  12/09/12 Cammi Consalvo RN,BSN NCM [phone] RECEIVED REFERRAL FOR MED ASST.PATIENT STATES HE IS ABLE TO PURCHASE MEDS ON OWN.MEDS ARE OXYCODONE-DOES NOT QUALIFY FOR MATCH PROGRAM.SENOKOT-OTC-NOT APPROPRIATE FOR MATCH PROGRAM.$4 [facility] LIST GIVEN.

## 2012-12-09 NOTE — Progress Notes (Signed)
Pt HR recorded as 48 bpm on the Dinamap during routine vital signs check this morning about 0405, HR  rechecked apical is 47bpm, Temp 98.1, BP 142/66, RR 16, sats 100% on room air. Patient asymptomatic, alert and oriented, denied any dizziness. Dr Brunilda Payor ( Urologist) on call for Dr Berneice Heinrich was notified, a telephone order to increase current rate of IV fluids from 2ml/hr to 160ml/hr  was received and done.  Before now, pt c/o urinary frequency and urgency, having the urge to void as often as every , pt sometimes voids as little as 10ml to 20 mls of bloody urine at a time. At about 0200 Pt c/o of nausea, threw up undigested food emesis x 1 and was medicated 4mg  of Zofran. Will continue to monitor.

## 2012-12-10 ENCOUNTER — Emergency Department (HOSPITAL_COMMUNITY)
Admission: EM | Admit: 2012-12-10 | Discharge: 2012-12-10 | Disposition: A | Discharge status: Home / Self Care | Payer: Self-pay | Attending: Emergency Medicine | Admitting: Emergency Medicine

## 2012-12-10 ENCOUNTER — Encounter (HOSPITAL_COMMUNITY): Payer: Self-pay | Admitting: Emergency Medicine

## 2012-12-10 DIAGNOSIS — Z880 Allergy status to penicillin: Secondary | ICD-10-CM | POA: Insufficient documentation

## 2012-12-10 DIAGNOSIS — Y849 Medical procedure, unspecified as the cause of abnormal reaction of the patient, or of later complication, without mention of misadventure at the time of the procedure: Secondary | ICD-10-CM | POA: Insufficient documentation

## 2012-12-10 DIAGNOSIS — I10 Essential (primary) hypertension: Secondary | ICD-10-CM | POA: Insufficient documentation

## 2012-12-10 DIAGNOSIS — Z8659 Personal history of other mental and behavioral disorders: Secondary | ICD-10-CM | POA: Insufficient documentation

## 2012-12-10 DIAGNOSIS — Z9889 Other specified postprocedural states: Secondary | ICD-10-CM | POA: Insufficient documentation

## 2012-12-10 DIAGNOSIS — Z87442 Personal history of urinary calculi: Secondary | ICD-10-CM | POA: Insufficient documentation

## 2012-12-10 DIAGNOSIS — R6883 Chills (without fever): Secondary | ICD-10-CM | POA: Insufficient documentation

## 2012-12-10 DIAGNOSIS — F17200 Nicotine dependence, unspecified, uncomplicated: Secondary | ICD-10-CM | POA: Insufficient documentation

## 2012-12-10 DIAGNOSIS — E669 Obesity, unspecified: Secondary | ICD-10-CM | POA: Insufficient documentation

## 2012-12-10 DIAGNOSIS — Z79899 Other long term (current) drug therapy: Secondary | ICD-10-CM | POA: Insufficient documentation

## 2012-12-10 DIAGNOSIS — T83122D Displacement of urinary stent, subsequent encounter: Secondary | ICD-10-CM

## 2012-12-10 DIAGNOSIS — R112 Nausea with vomiting, unspecified: Secondary | ICD-10-CM | POA: Insufficient documentation

## 2012-12-10 DIAGNOSIS — Z792 Long term (current) use of antibiotics: Secondary | ICD-10-CM | POA: Insufficient documentation

## 2012-12-10 DIAGNOSIS — N390 Urinary tract infection, site not specified: Secondary | ICD-10-CM | POA: Insufficient documentation

## 2012-12-10 DIAGNOSIS — T8389XA Other specified complication of genitourinary prosthetic devices, implants and grafts, initial encounter: Secondary | ICD-10-CM | POA: Insufficient documentation

## 2012-12-10 LAB — CBC WITH DIFFERENTIAL/PLATELET
Basophils Absolute: 0 10*3/uL (ref 0.0–0.1)
Basophils Relative: 0 % (ref 0–1)
Eosinophils Relative: 3 % (ref 0–5)
HCT: 41.7 % (ref 39.0–52.0)
Hemoglobin: 14.1 g/dL (ref 13.0–17.0)
Lymphocytes Relative: 19 % (ref 12–46)
MCHC: 33.8 g/dL (ref 30.0–36.0)
Monocytes Absolute: 0.8 10*3/uL (ref 0.1–1.0)
Monocytes Relative: 13 % — ABNORMAL HIGH (ref 3–12)
Neutro Abs: 4.2 10*3/uL (ref 1.7–7.7)
Neutrophils Relative %: 65 % (ref 43–77)
Platelets: 215 10*3/uL (ref 150–400)
RDW: 12.7 % (ref 11.5–15.5)
WBC: 6.4 10*3/uL (ref 4.0–10.5)

## 2012-12-10 LAB — LIPASE, BLOOD: Lipase: 13 U/L (ref 11–59)

## 2012-12-10 LAB — URINALYSIS, ROUTINE W REFLEX MICROSCOPIC
Glucose, UA: NEGATIVE mg/dL
Ketones, ur: 40 mg/dL — AB
Nitrite: POSITIVE — AB
pH: 5 (ref 5.0–8.0)

## 2012-12-10 LAB — COMPREHENSIVE METABOLIC PANEL
ALT: 20 U/L (ref 0–53)
AST: 20 U/L (ref 0–37)
Albumin: 3.5 g/dL (ref 3.5–5.2)
CO2: 22 mEq/L (ref 19–32)
Chloride: 104 mEq/L (ref 96–112)
Creatinine, Ser: 0.99 mg/dL (ref 0.50–1.35)
GFR calc non Af Amer: >90 mL/min (ref >90)
Glucose, Bld: 87 mg/dL (ref 70–99)
Sodium: 139 mEq/L (ref 135–145)
Total Bilirubin: 1.2 mg/dL (ref 0.3–1.2)

## 2012-12-10 LAB — URINE MICROSCOPIC-ADD ON

## 2012-12-10 MED ORDER — CIPROFLOXACIN HCL 500 MG PO TABS: 500.0000 mg | ORAL_TABLET | Freq: Two times a day (BID) | ORAL | Status: DC

## 2012-12-10 MED ORDER — CIPROFLOXACIN IN D5W 400 MG/200ML IV SOLN
400.0000 mg | Freq: Once | INTRAVENOUS | Status: AC
  Administered 2012-12-10: 400 mg via INTRAVENOUS
  Filled 2012-12-10: qty 200

## 2012-12-10 MED ORDER — HYDROMORPHONE HCL PF 1 MG/ML IJ SOLN
1.0000 mg | Freq: Once | INTRAMUSCULAR | Status: AC
  Administered 2012-12-10: 1 mg via INTRAVENOUS
  Filled 2012-12-10: qty 1

## 2012-12-10 MED ORDER — ONDANSETRON HCL 4 MG/2ML IJ SOLN
4.0000 mg | Freq: Once | INTRAMUSCULAR | Status: AC
  Administered 2012-12-10: 4 mg via INTRAVENOUS
  Filled 2012-12-10: qty 2

## 2012-12-10 MED ORDER — SODIUM CHLORIDE 0.9 % IV BOLUS (SEPSIS)
1000.0000 mL | Freq: Once | INTRAVENOUS | Status: AC
  Administered 2012-12-10: 1000 mL via INTRAVENOUS

## 2012-12-10 NOTE — ED Provider Notes (Signed)
CSN: [id number]     Arrival date & time 12/10/12  0756 History   First MD Initiated Contact with Patient 12/10/12 0805     Chief Complaint  Patient presents with  . Abdominal Pain   (Consider location/radiation/quality/duration/timing/severity/associated sxs/prior Treatment) HPI  This is a 32 year old male with a history of hypertension and recent nephrolithiasis status post ureteral stent placement bilaterally who presents with back pain and abdominal pain. The patient states he was discharged from hospital yesterday. He states that he's been unable to tolerate by mouth since that time. He reports 10 out of 10 epigastric sharp abdominal pain that radiates to the bilateral flanks. It is not worsened with food intake. Patient has not been able to have his pain medication filled. Patient reports constipation with his last normal bowel movement on Saturday. Patient reports that his abdominal pain gets worse with urination. He has noted hematuria. He denies any fevers but states that he has been chilled.  Past Medical History  Diagnosis Date  . Anxiety   . Hypertension   . Migraine    Past Surgical History  Procedure Laterality Date  . Cystoscopy w/ ureteral stent placement Bilateral 12/08/2012    Procedure: CYSTOSCOPY WITH RETROGRADE PYELOGRAM/URETERAL STENT PLACEMENT, left diagnostic ureteroscopy;  Surgeon: Sebastian Ache, MD;  Location: [facility];  Service: Urology;  Laterality: Bilateral;   History reviewed. No pertinent family history. History  Substance Use Topics  . Smoking status: Current Every Day Smoker    Types: Cigarettes  . Smokeless tobacco: Not on file  . Alcohol Use: Yes     Comment: weekends    Review of Systems  Constitutional: Positive for chills. Negative for fever.  Respiratory: Negative.  Negative for chest tightness and shortness of breath.   Cardiovascular: Negative.  Negative for chest pain.  Gastrointestinal: Positive for nausea, vomiting and abdominal pain.   Genitourinary: Positive for dysuria, hematuria and flank pain.  Musculoskeletal: Negative for back pain.  Skin: Negative for rash.  Neurological: Negative for headaches.  All other systems reviewed and are negative.    Allergies  Penicillins  Home Medications   Current Outpatient Rx  Name  Route  Sig  Dispense  Refill  . oxyCODONE-acetaminophen (PERCOCET/ROXICET) 5-325 MG per tablet   Oral   Take 1 tablet by mouth every 4 (four) hours as needed. For post-op pain   30 tablet   0   . ciprofloxacin (CIPRO) 500 MG tablet   Oral   Take 1 tablet (500 mg total) by mouth every 12 (twelve) hours.   20 tablet   0   . senna-docusate (SENOKOT-S) 8.6-50 MG per tablet   Oral   Take 1 tablet by mouth 2 (two) times daily. While taking pain meds to prevent constipation.   30 tablet   1    BP 152/70  Pulse 61  Temp(Src) 99.1 F (37.3 C) (Oral)  Resp 18  SpO2 100% Physical Exam  Nursing note and vitals reviewed. Constitutional: He is oriented to person, place, and time.  Obese, no acute distress  HENT:  Head: Normocephalic and atraumatic.  mucous membranes dry  Eyes: Pupils are equal, round, and reactive to light.  Neck: Neck supple.  Cardiovascular: Normal rate, regular rhythm and normal heart sounds.   No murmur heard. Pulmonary/Chest: Effort normal and breath sounds normal. No respiratory distress. He has no wheezes.  Abdominal: Soft. Bowel sounds are normal. There is tenderness. There is no rebound and no guarding.  Tenderness to palpation over the right  upper quadrant, epigastrium, and left upper quadrant, no CVA tenderness noted  Musculoskeletal: He exhibits no edema.  Lymphadenopathy:    He has no cervical adenopathy.  Neurological: He is alert and oriented to person, place, and time.  Skin: Skin is warm and dry.  Psychiatric: He has a normal mood and affect.    ED Course  Procedures (including critical care time) Labs Review Labs Reviewed  CBC WITH  DIFFERENTIAL - Abnormal; Notable for the following:    Monocytes Relative 13 (*)    All other components within normal limits  COMPREHENSIVE METABOLIC PANEL - Abnormal; Notable for the following:    Potassium 3.4 (*)    BUN 4 (*)    All other components within normal limits  URINALYSIS, ROUTINE W REFLEX MICROSCOPIC - Abnormal; Notable for the following:    Color, Urine RED (*)    APPearance CLOUDY (*)    Hgb urine dipstick LARGE (*)    Bilirubin Urine LARGE (*)    Ketones, ur 40 (*)    Protein, ur >300 (*)    Nitrite POSITIVE (*)    Leukocytes, UA MODERATE (*)    All other components within normal limits  URINE MICROSCOPIC-ADD ON - Abnormal; Notable for the following:    Bacteria, UA MANY (*)    All other components within normal limits  URINE CULTURE  LIPASE, BLOOD   Imaging Review No results found.  MDM   1. Ureteral stent displacement, subsequent encounter   2. Urinary tract infection      10:04 AM Review of patient's labs reveal nitrite-positive urine. Patient was given IV ciprofloxacin. He has no past cultures. I discussed the patient with Dr. Berneice Heinrich who placed his stents.  He agrees to ciprofloxacin. Recheck the patient reveals improvement of the patient's pain.  Patient will be discharged with cipro.  Urology follow-up as already scheduled.  After history, exam, and medical workup I feel the patient has been appropriately medically screened and is safe for discharge home. Pertinent diagnoses were discussed with the patient. Patient was given return precautions.    Shon Baton, MD 12/10/12 [phone]

## 2012-12-10 NOTE — ED Notes (Signed)
Pt c/o of abd pain 10/10 and bloody urine. States that he was seen for the same on Sunday and had a stent placed.

## 2012-12-11 ENCOUNTER — Encounter (HOSPITAL_COMMUNITY): Payer: Self-pay | Admitting: Emergency Medicine

## 2012-12-11 ENCOUNTER — Other Ambulatory Visit: Payer: Self-pay | Admitting: Emergency Medicine

## 2012-12-11 ENCOUNTER — Emergency Department (HOSPITAL_COMMUNITY): Payer: Self-pay

## 2012-12-11 ENCOUNTER — Emergency Department (HOSPITAL_COMMUNITY): Admission: EM | Admit: 2012-12-11 | Discharge: 2012-12-11 | Discharge status: Against Medical Advice | Payer: Self-pay

## 2012-12-11 ENCOUNTER — Emergency Department (HOSPITAL_COMMUNITY)
Admission: EM | Admit: 2012-12-11 | Discharge: 2012-12-11 | Disposition: A | Discharge status: Home / Self Care | Payer: Self-pay | Attending: Emergency Medicine | Admitting: Emergency Medicine

## 2012-12-11 ENCOUNTER — Emergency Department (HOSPITAL_COMMUNITY)
Admission: EM | Admit: 2012-12-11 | Discharge: 2012-12-12 | Disposition: A | Discharge status: Home / Self Care | Payer: Self-pay | Attending: Emergency Medicine | Admitting: Emergency Medicine

## 2012-12-11 DIAGNOSIS — K5900 Constipation, unspecified: Secondary | ICD-10-CM | POA: Insufficient documentation

## 2012-12-11 DIAGNOSIS — R110 Nausea: Secondary | ICD-10-CM

## 2012-12-11 DIAGNOSIS — N390 Urinary tract infection, site not specified: Secondary | ICD-10-CM

## 2012-12-11 DIAGNOSIS — Z8659 Personal history of other mental and behavioral disorders: Secondary | ICD-10-CM | POA: Insufficient documentation

## 2012-12-11 DIAGNOSIS — R112 Nausea with vomiting, unspecified: Secondary | ICD-10-CM

## 2012-12-11 DIAGNOSIS — N200 Calculus of kidney: Secondary | ICD-10-CM

## 2012-12-11 DIAGNOSIS — Z792 Long term (current) use of antibiotics: Secondary | ICD-10-CM | POA: Insufficient documentation

## 2012-12-11 DIAGNOSIS — R109 Unspecified abdominal pain: Secondary | ICD-10-CM | POA: Insufficient documentation

## 2012-12-11 DIAGNOSIS — F17200 Nicotine dependence, unspecified, uncomplicated: Secondary | ICD-10-CM | POA: Insufficient documentation

## 2012-12-11 DIAGNOSIS — Z79899 Other long term (current) drug therapy: Secondary | ICD-10-CM | POA: Insufficient documentation

## 2012-12-11 DIAGNOSIS — I10 Essential (primary) hypertension: Secondary | ICD-10-CM | POA: Insufficient documentation

## 2012-12-11 DIAGNOSIS — Z880 Allergy status to penicillin: Secondary | ICD-10-CM | POA: Insufficient documentation

## 2012-12-11 DIAGNOSIS — E669 Obesity, unspecified: Secondary | ICD-10-CM | POA: Insufficient documentation

## 2012-12-11 LAB — CBC WITH DIFFERENTIAL/PLATELET
Basophils Absolute: 0 10*3/uL (ref 0.0–0.1)
Basophils Relative: 0 % (ref 0–1)
Eosinophils Absolute: 0.2 10*3/uL (ref 0.0–0.7)
HCT: 40.7 % (ref 39.0–52.0)
Hemoglobin: 13.9 g/dL (ref 13.0–17.0)
MCH: 28.7 pg (ref 26.0–34.0)
MCHC: 34.2 g/dL (ref 30.0–36.0)
MCV: 83.9 fL (ref 78.0–100.0)
Monocytes Absolute: 0.8 10*3/uL (ref 0.1–1.0)
Neutro Abs: 4 10*3/uL (ref 1.7–7.7)
Neutrophils Relative %: 67 % (ref 43–77)
Platelets: 213 10*3/uL (ref 150–400)
RDW: 12.5 % (ref 11.5–15.5)

## 2012-12-11 LAB — URINE CULTURE
Colony Count: NO GROWTH
Culture: NO GROWTH

## 2012-12-11 LAB — POCT I-STAT, CHEM 8
Calcium, Ion: 1.24 mmol/L — ABNORMAL HIGH (ref 1.12–1.23)
Creatinine, Ser: 1.3 mg/dL (ref 0.50–1.35)
Glucose, Bld: 90 mg/dL (ref 70–99)
Hemoglobin: 13.6 g/dL (ref 13.0–17.0)
Potassium: 3.3 mEq/L — ABNORMAL LOW (ref 3.5–5.1)

## 2012-12-11 LAB — URINE MICROSCOPIC-ADD ON

## 2012-12-11 MED ORDER — SODIUM CHLORIDE 0.9 % IV BOLUS (SEPSIS)
1000.0000 mL | Freq: Once | INTRAVENOUS | Status: AC
  Administered 2012-12-11: 1000 mL via INTRAVENOUS

## 2012-12-11 MED ORDER — ONDANSETRON HCL 4 MG/2ML IJ SOLN
4.0000 mg | Freq: Once | INTRAMUSCULAR | Status: AC
  Administered 2012-12-11: 4 mg via INTRAVENOUS
  Filled 2012-12-11: qty 2

## 2012-12-11 MED ORDER — KETOROLAC TROMETHAMINE 30 MG/ML IJ SOLN
30.0000 mg | Freq: Once | INTRAMUSCULAR | Status: AC
  Administered 2012-12-11: 30 mg via INTRAVENOUS
  Filled 2012-12-11: qty 1

## 2012-12-11 MED ORDER — PROMETHAZINE HCL 25 MG RE SUPP: 25.0000 mg | Freq: Four times a day (QID) | RECTAL | Status: DC | PRN

## 2012-12-11 MED ORDER — ONDANSETRON 8 MG PO TBDP: ORAL_TABLET | ORAL | Status: DC

## 2012-12-11 MED ORDER — CIPROFLOXACIN IN D5W 400 MG/200ML IV SOLN
400.0000 mg | Freq: Once | INTRAVENOUS | Status: AC
  Administered 2012-12-11: 400 mg via INTRAVENOUS
  Filled 2012-12-11: qty 200

## 2012-12-11 MED ORDER — PROMETHAZINE HCL 25 MG PO TABS: 25.0000 mg | ORAL_TABLET | Freq: Four times a day (QID) | ORAL | Status: DC | PRN

## 2012-12-11 MED ORDER — MORPHINE SULFATE 4 MG/ML IJ SOLN
4.0000 mg | Freq: Once | INTRAMUSCULAR | Status: AC
  Administered 2012-12-11: 4 mg via INTRAVENOUS
  Filled 2012-12-11: qty 1

## 2012-12-11 NOTE — ED Provider Notes (Signed)
Medical screening examination/treatment/procedure(s) were conducted as a shared visit with non-physician practitioner(s) and myself.  I personally evaluated the patient during the encounter  Please see my other note for complete details  Lyanne Co, MD 12/11/12 [phone]

## 2012-12-11 NOTE — ED Notes (Signed)
Pt able to drink fluids without vomiting

## 2012-12-11 NOTE — ED Notes (Signed)
Pt noted to be drowsy, per Theron Arista, [place] pt may wait in room.

## 2012-12-11 NOTE — ED Notes (Signed)
Another RN to attempt IV at this time, x2 unsuccessful

## 2012-12-11 NOTE — ED Notes (Signed)
Pt to radiology at this time.

## 2012-12-11 NOTE — ED Provider Notes (Signed)
CSN: [id number]     Arrival date & time 12/11/12  0151 History   First MD Initiated Contact with Patient 12/11/12 0232     Chief Complaint  Patient presents with  . Abdominal Pain  . Nausea   HPI  History provided by the patient in recent medical charts. Patient is a 32 year old male with history of hypertension and obesity recently admitted to the hospital for ureteral stent placement after finding 10 mm left kidney stone. Patient was discharged on Monday but was having persistent nausea vomiting and pain uncontrolled with home medications and return to emergency room. He was treated with IV medications improved and sent home. Patient states he did slightly better in the afternoon but began having return of nausea vomiting and has been unable to keep down any of his vacations or antibiotics. He continues to complain of diffuse pain through the abdomen as well as to the flanks bilaterally. Symptoms have been associated with some chills and also constipation. He denies any fevers. Denies any other aggravating or alleviating factors. No other associated symptoms.    Past Medical History  Diagnosis Date  . Anxiety   . Hypertension   . Migraine    Past Surgical History  Procedure Laterality Date  . Cystoscopy w/ ureteral stent placement Bilateral 12/08/2012    Procedure: CYSTOSCOPY WITH RETROGRADE PYELOGRAM/URETERAL STENT PLACEMENT, left diagnostic ureteroscopy;  Surgeon: Sebastian Ache, MD;  Location: [facility];  Service: Urology;  Laterality: Bilateral;   No family history on file. History  Substance Use Topics  . Smoking status: Current Every Day Smoker    Types: Cigarettes  . Smokeless tobacco: Not on file  . Alcohol Use: Yes     Comment: weekends    Review of Systems  Constitutional: Positive for chills and appetite change. Negative for fever.  Respiratory: Negative for shortness of breath.   Cardiovascular: Negative for chest pain.  Gastrointestinal: Positive for nausea, vomiting,  abdominal pain and constipation. Negative for diarrhea.  Genitourinary: Positive for hematuria and flank pain. Negative for dysuria and frequency.  All other systems reviewed and are negative.    Allergies  Penicillins  Home Medications   Current Outpatient Rx  Name  Route  Sig  Dispense  Refill  . ciprofloxacin (CIPRO) 500 MG tablet   Oral   Take 1 tablet (500 mg total) by mouth every 12 (twelve) hours.   20 tablet   0   . oxyCODONE-acetaminophen (PERCOCET/ROXICET) 5-325 MG per tablet   Oral   Take 1 tablet by mouth every 4 (four) hours as needed. For post-op pain   30 tablet   0   . senna-docusate (SENOKOT-S) 8.6-50 MG per tablet   Oral   Take 1 tablet by mouth 2 (two) times daily. While taking pain meds to prevent constipation.   30 tablet   1    BP 184/96  Pulse 59  SpO2 95% Physical Exam  Nursing note and vitals reviewed. Constitutional: He is oriented to person, place, and time. He appears well-developed and well-nourished. No distress.  Obese  HENT:  Head: Normocephalic and atraumatic.  mucous membranes dry  Eyes: Pupils are equal, round, and reactive to light.  Neck: Neck supple.  Cardiovascular: Normal rate, regular rhythm and normal heart sounds.   No murmur heard. Pulmonary/Chest: Effort normal and breath sounds normal. No respiratory distress. He has no wheezes.  Abdominal: Soft. Bowel sounds are normal. There is tenderness. There is no rebound and no guarding.  Tenderness to palpation over  the right upper quadrant, epigastrium, and left upper quadrant, no CVA tenderness noted  Musculoskeletal: He exhibits no edema.  Lymphadenopathy:    He has no cervical adenopathy.  Neurological: He is alert and oriented to person, place, and time.  Skin: Skin is warm and dry.  Psychiatric: He has a normal mood and affect.    ED Course  Procedures     COORDINATION OF CARE:  Nursing notes reviewed. Vital signs reviewed. Initial pt interview and examination  performed.  Treatment plan initiated: Medications  sodium chloride 0.9 % bolus 1,000 mL (not administered)  ciprofloxacin (CIPRO) IVPB 400 mg (not administered)  ondansetron (ZOFRAN) injection 4 mg (not administered)  ketorolac (TORADOL) 30 MG/ML injection 30 mg (not administered)  morphine 4 MG/ML injection 4 mg (not administered)    2:34 AM-Discussed treatment plan with pt at bedside, which includes IV fluids, antibiotics, anti-medics and pain medication. Pt agrees with plan.  Initial diagnostic testing ordered.    5:00AM patient having improvement of symptoms after medications and fluids. He is able to tolerate some by mouth fluids.  Pt seen and discussed with Attending Physician.  Will consult Urology.   5:45 AM spoke with Dr. Berneice Heinrich with urology. He states that from his standpoint there is nothing else to be done except symptomatic treatment. Recommends Phenergan for his nausea and vomiting and continued antibiotics. He is happy to see the patient in clinic if he continues to have symptoms despite treatment with Phenergan. Otherwise he feels patient may be discharged home.   Results for orders placed during the hospital encounter of 12/11/12  CBC WITH DIFFERENTIAL      Result Value Range   WBC 6.0  4.0 - 10.5 K/uL   RBC 4.85  4.22 - 5.81 MIL/uL   Hemoglobin 13.9  13.0 - 17.0 g/dL   HCT 16.1  09.6 - 04.5 %   MCV 83.9  78.0 - 100.0 fL   MCH 28.7  26.0 - 34.0 pg   MCHC 34.2  30.0 - 36.0 g/dL   RDW 40.9  81.1 - 91.4 %   Platelets 213  150 - 400 K/uL   Neutrophils Relative % 67  43 - 77 %   Neutro Abs 4.0  1.7 - 7.7 K/uL   Lymphocytes Relative 16  12 - 46 %   Lymphs Abs 1.0  0.7 - 4.0 K/uL   Monocytes Relative 13 (*) 3 - 12 %   Monocytes Absolute 0.8  0.1 - 1.0 K/uL   Eosinophils Relative 4  0 - 5 %   Eosinophils Absolute 0.2  0.0 - 0.7 K/uL   Basophils Relative 0  0 - 1 %   Basophils Absolute 0.0  0.0 - 0.1 K/uL  URINE MICROSCOPIC-ADD ON      Result Value Range   Squamous  Epithelial / LPF RARE  RARE   WBC, UA 3-6  <3 WBC/hpf   RBC / HPF TOO NUMEROUS TO COUNT  <3 RBC/hpf   Bacteria, UA RARE  RARE   Urine-Other URINALYSIS PERFORMED ON SUPERNATANT        Imaging Review Dg Abd 1 View  12/11/2012   *RADIOLOGY REPORT*  Clinical Data: Abdominal pain and nausea.  Known renal stones.  ABDOMEN - 1 VIEW  Comparison: Abdominal radiograph performed 10/16/2011, and CT of the abdomen and pelvis performed 12/08/2012  Findings: Bilateral ureteral stents are noted in expected position. A 1.0 cm stone is noted at the lower pole of the left kidney; this may have  fallen back into the left renal calyces, status post ureteral stent placement.  The known right-sided renal stone is not definitely characterized on radiograph.  The visualized bowel gas pattern is unremarkable.  Scattered air and stool filled loops of colon are seen; no abnormal dilatation of small bowel loops is seen to suggest small bowel obstruction.  No free intra-abdominal air is identified, though evaluation for free air is limited on a single supine view.  The visualized osseous structures are within normal limits; the sacroiliac joints are unremarkable in appearance.  IMPRESSION:  1.  Bilateral ureteral stents noted in expected position.  1.0 cm stone noted at the lower pole of the left kidney; this may have fallen back into the left renal calyces, status post ureteral stent placement. 2.  Unremarkable bowel gas pattern; no free intra-abdominal air seen.   Original Report Authenticated By: Tonia Ghent, M.D.    MDM   1. Nausea & vomiting   2. Kidney stone   3. UTI (lower urinary tract infection)          Angus Seller, PA-C 12/11/12 [phone]

## 2012-12-11 NOTE — ED Provider Notes (Signed)
5:48 AM Patient has significant improvement in his symptoms after symptomatic treatment in the emergency department.  Bilateral ureteral stents are in place.  White blood cell count is normal.  No fever documented in the ER however patient with subjective fevers and chills at home along with associated dysuria.  Likely urinary tract infection with possible developing pyelonephritis.  Given the fact that the patient is young and otherwise healthy and feels much better at this time I think it's not unreasonable after our discussion with urology to have the patient discharged home.  Patient understands to return the ER for new or worsening symptoms.  Patient was instructed to call the urology office for any ongoing symptoms for which can likely be managed in an office setting.  No signs of sepsis at this time.  Indication for additional imaging.  Patient was not sent home with any antinausea medicine yesterday and had he been he may have had better ability to manage his symptoms at home.  Nontoxic  Lyanne Co, MD 12/11/12 [phone]

## 2012-12-11 NOTE — ED Notes (Signed)
Pt c/o emesis x 6 in last hour, +hematuria. Pt seen for and admitted for same recently.

## 2012-12-11 NOTE — ED Notes (Signed)
See downtime RN paperwork for triage and notes

## 2012-12-12 LAB — CBC WITH DIFFERENTIAL/PLATELET
Basophils Absolute: 0 10*3/uL (ref 0.0–0.1)
Basophils Relative: 0 % (ref 0–1)
Eosinophils Absolute: 0.3 10*3/uL (ref 0.0–0.7)
Eosinophils Relative: 4 % (ref 0–5)
HCT: 39.2 % (ref 39.0–52.0)
Hemoglobin: 13 g/dL (ref 13.0–17.0)
Lymphocytes Relative: 15 % (ref 12–46)
Lymphs Abs: 1.1 10*3/uL (ref 0.7–4.0)
MCH: 27.8 pg (ref 26.0–34.0)
MCHC: 33.2 g/dL (ref 30.0–36.0)
MCV: 83.8 fL (ref 78.0–100.0)
Monocytes Absolute: 0.8 10*3/uL (ref 0.1–1.0)
Monocytes Relative: 12 % (ref 3–12)
Neutro Abs: 5 10*3/uL (ref 1.7–7.7)
Neutrophils Relative %: 69 % (ref 43–77)
Platelets: 221 10*3/uL (ref 150–400)
RBC: 4.68 MIL/uL (ref 4.22–5.81)
RDW: 12.6 % (ref 11.5–15.5)
WBC: 7.2 10*3/uL (ref 4.0–10.5)

## 2012-12-12 LAB — BASIC METABOLIC PANEL
BUN: 6 mg/dL (ref 6–23)
CO2: 25 mEq/L (ref 19–32)
Calcium: 9 mg/dL (ref 8.4–10.5)
Chloride: 103 mEq/L (ref 96–112)
Creatinine, Ser: 1.79 mg/dL — ABNORMAL HIGH (ref 0.50–1.35)
GFR calc Af Amer: 57 mL/min — ABNORMAL LOW (ref >90)
GFR calc non Af Amer: 50 mL/min — ABNORMAL LOW (ref >90)
Glucose, Bld: 98 mg/dL (ref 70–99)
Potassium: 3.2 mEq/L — ABNORMAL LOW (ref 3.5–5.1)
Sodium: 138 mEq/L (ref 135–145)

## 2012-12-12 LAB — URINALYSIS, ROUTINE W REFLEX MICROSCOPIC
Bilirubin Urine: NEGATIVE
Glucose, UA: NEGATIVE mg/dL
Ketones, ur: 15 mg/dL — AB
Nitrite: NEGATIVE
Protein, ur: 100 mg/dL — AB
Specific Gravity, Urine: 1.016 (ref 1.005–1.030)
Urobilinogen, UA: 1 mg/dL (ref 0.0–1.0)
pH: 5.5 (ref 5.0–8.0)

## 2012-12-12 LAB — URINE MICROSCOPIC-ADD ON

## 2012-12-12 MED ORDER — HYDROMORPHONE HCL PF 1 MG/ML IJ SOLN
1.0000 mg | Freq: Once | INTRAMUSCULAR | Status: AC
  Administered 2012-12-12: 1 mg via INTRAVENOUS
  Filled 2012-12-12: qty 1

## 2012-12-12 MED ORDER — PROMETHAZINE HCL 25 MG/ML IJ SOLN
12.5000 mg | Freq: Once | INTRAMUSCULAR | Status: AC
  Administered 2012-12-12: 12.5 mg via INTRAVENOUS
  Filled 2012-12-12: qty 1

## 2012-12-12 MED ORDER — SODIUM CHLORIDE 0.9 % IV BOLUS (SEPSIS)
2000.0000 mL | Freq: Once | INTRAVENOUS | Status: AC
  Administered 2012-12-12: 2000 mL via INTRAVENOUS

## 2012-12-12 MED ORDER — ONDANSETRON 8 MG PO TBDP: ORAL_TABLET | ORAL | Status: DC

## 2012-12-12 NOTE — ED Notes (Signed)
Pt has rested and has had no vomiting.  Pt given water to drink to check tolerance.  MD made aware.

## 2012-12-12 NOTE — ED Provider Notes (Signed)
CSN: [id number]     Arrival date & time 12/11/12  2305 History   First MD Initiated Contact with Patient 12/12/12 0005     Chief Complaint  Patient presents with  . Emesis   (Consider location/radiation/quality/duration/timing/severity/associated sxs/prior Treatment) HPI Patient presents to the emergency department with continued nausea and vomiting, along with abdominal pain.  Patient was seen here several times over the last 2 days.  Patient had recent bilateral stents placed in his ureters.  The patient has had no fevers patient denies chest pain, shortness of breath back pain, headache, blurred vision, weakness, dizziness, or numbness.  The patient, states, that he has followup with urology Past Medical History  Diagnosis Date  . Anxiety   . Hypertension   . Migraine    Past Surgical History  Procedure Laterality Date  . Cystoscopy w/ ureteral stent placement Bilateral 12/08/2012    Procedure: CYSTOSCOPY WITH RETROGRADE PYELOGRAM/URETERAL STENT PLACEMENT, left diagnostic ureteroscopy;  Surgeon: Sebastian Ache, MD;  Location: [facility];  Service: Urology;  Laterality: Bilateral;   No family history on file. History  Substance Use Topics  . Smoking status: Current Every Day Smoker    Types: Cigarettes  . Smokeless tobacco: Not on file  . Alcohol Use: Yes     Comment: weekends    Review of Systems All other systems negative except as documented in the HPI. All pertinent positives and negatives as reviewed in the HPI. Allergies  Penicillins  Home Medications   Current Outpatient Rx  Name  Route  Sig  Dispense  Refill  . ciprofloxacin (CIPRO) 500 MG tablet   Oral   Take 1 tablet (500 mg total) by mouth every 12 (twelve) hours.   20 tablet   0   . ondansetron (ZOFRAN ODT) 8 MG disintegrating tablet      8mg  ODT q4 hours prn nausea   10 tablet   0   . oxyCODONE-acetaminophen (PERCOCET/ROXICET) 5-325 MG per tablet   Oral   Take 1 tablet by mouth every 4 (four) hours as  needed. For post-op pain   30 tablet   0   . promethazine (PHENERGAN) 25 MG tablet   Oral   Take 1 tablet (25 mg total) by mouth every 6 (six) hours as needed for nausea.   20 tablet   0   . senna-docusate (SENOKOT-S) 8.6-50 MG per tablet   Oral   Take 1 tablet by mouth 2 (two) times daily. While taking pain meds to prevent constipation.   30 tablet   1   . promethazine (PHENERGAN) 25 MG suppository   Rectal   Place 1 suppository (25 mg total) rectally every 6 (six) hours as needed for nausea.   12 each   0    BP 154/81  Pulse 62  Temp(Src) 99.1 F (37.3 C) (Oral)  Resp 20  Ht 6' (1.829 m)  Wt 393 lb (178.264 kg)  BMI 53.29 kg/m2  SpO2 98% Physical Exam  Nursing note and vitals reviewed. Constitutional: He is oriented to person, place, and time. He appears well-developed and well-nourished.  HENT:  Head: Normocephalic and atraumatic.  Mouth/Throat: Oropharynx is clear and moist.  Eyes: Pupils are equal, round, and reactive to light.  Neck: Normal range of motion. Neck supple.  Cardiovascular: Normal rate, regular rhythm and normal heart sounds.  Exam reveals no gallop and no friction rub.   No murmur heard. Pulmonary/Chest: Effort normal and breath sounds normal. No respiratory distress.  Abdominal: Soft. Bowel sounds  are normal. He exhibits no distension. There is tenderness. There is no rebound and no guarding.  Neurological: He is alert and oriented to person, place, and time. No cranial nerve deficit. He exhibits normal muscle tone. Coordination normal.  Skin: Skin is warm. No erythema.    ED Course  Procedures (including critical care time) Labs Review Labs Reviewed  CBC WITH DIFFERENTIAL  BASIC METABOLIC PANEL  URINALYSIS, ROUTINE W REFLEX MICROSCOPIC   Imaging Review Dg Abd 1 View  12/11/2012   *RADIOLOGY REPORT*  Clinical Data: Abdominal pain and nausea.  Known renal stones.  ABDOMEN - 1 VIEW  Comparison: Abdominal radiograph performed 10/16/2011, and  CT of the abdomen and pelvis performed 12/08/2012  Findings: Bilateral ureteral stents are noted in expected position. A 1.0 cm stone is noted at the lower pole of the left kidney; this may have fallen back into the left renal calyces, status post ureteral stent placement.  The known right-sided renal stone is not definitely characterized on radiograph.  The visualized bowel gas pattern is unremarkable.  Scattered air and stool filled loops of colon are seen; no abnormal dilatation of small bowel loops is seen to suggest small bowel obstruction.  No free intra-abdominal air is identified, though evaluation for free air is limited on a single supine view.  The visualized osseous structures are within normal limits; the sacroiliac joints are unremarkable in appearance.  IMPRESSION:  1.  Bilateral ureteral stents noted in expected position.  1.0 cm stone noted at the lower pole of the left kidney; this may have fallen back into the left renal calyces, status post ureteral stent placement. 2.  Unremarkable bowel gas pattern; no free intra-abdominal air seen.   Original Report Authenticated By: Tonia Ghent, M.D.   Patient will be observed and further testing be initiated.  The patient is given IV fluids the oncoming nurse practitioner will assume care for the patient until of results are returned MDM     Carlyle Dolly, PA-C 12/13/12 0133

## 2012-12-12 NOTE — ED Notes (Addendum)
Patient reports new onset of nausea after drinking water, no emesis. MD aware, okay for patient to go home.

## 2012-12-16 NOTE — ED Provider Notes (Signed)
  Medical screening examination/treatment/procedure(s) were performed by non-physician practitioner and as supervising physician I was immediately available for consultation/collaboration.   Gerhard Munch, MD 12/16/12 1031

## 2012-12-27 ENCOUNTER — Encounter (HOSPITAL_COMMUNITY): Payer: Self-pay | Admitting: Pharmacy Technician

## 2012-12-27 ENCOUNTER — Encounter (HOSPITAL_COMMUNITY)
Admission: RE | Admit: 2012-12-27 | Discharge: 2012-12-27 | Disposition: A | Discharge status: Home / Self Care | Payer: Self-pay | Source: Ambulatory Visit | Attending: Urology | Admitting: Urology

## 2012-12-27 ENCOUNTER — Encounter (HOSPITAL_COMMUNITY): Payer: Self-pay

## 2012-12-27 DIAGNOSIS — Z01812 Encounter for preprocedural laboratory examination: Secondary | ICD-10-CM | POA: Insufficient documentation

## 2012-12-27 DIAGNOSIS — Z01818 Encounter for other preprocedural examination: Secondary | ICD-10-CM | POA: Insufficient documentation

## 2012-12-27 HISTORY — DX: Unspecified asthma, uncomplicated: J45.909

## 2012-12-27 HISTORY — DX: Personal history of urinary calculi: Z87.442

## 2012-12-27 HISTORY — DX: Target of (perceived) adverse discrimination and persecution: Z60.5

## 2012-12-27 LAB — BASIC METABOLIC PANEL
BUN: 11 mg/dL (ref 6–23)
CO2: 25 mEq/L (ref 19–32)
Chloride: 104 mEq/L (ref 96–112)
Creatinine, Ser: 0.95 mg/dL (ref 0.50–1.35)
Sodium: 139 mEq/L (ref 135–145)

## 2012-12-27 NOTE — Patient Instructions (Addendum)
22 Keith Crane  12/27/2012   Your procedure is scheduled on: 10-29  -2014  Report to [facility] at    0630    AM   Call this number if you have problems the morning of surgery: [phone]  Or Presurgical Testing [phone](Breane Grunwald)      Do not eat food:After Midnight.   Take these medicines the morning of surgery with A SIP OF WATER: none. Cipro (if not complete)   Do not wear jewelry, make-up or nail polish.  Do not wear lotions, powders, or perfumes. You may wear deodorant.  Do not shave 12 hours prior to first CHG shower(legs and under arms).(face and neck okay.)  Do not bring valuables to the hospital.  Contacts, dentures or removable bridgework, body piercing, hair pins may not be worn into surgery.  Leave suitcase in the car. After surgery it may be brought to your room.  For patients admitted to the hospital, checkout time is 11:00 AM the day of discharge.   Patients discharged the day of surgery will not be allowed to drive home. Must have responsible person with you x 24 hours once discharged.  Name and phone number of your driver: Dedra Skeens [phone]  Special Instructions: CHG(Chlorhedine 4%-"Hibiclens","Betasept","Aplicare") Shower Use Special Wash: see special instructions.(avoid face and genitals)      Failure to follow these instructions may result in Cancellation of your surgery.   Patient signature_______________________________________________________

## 2012-12-31 MED ORDER — GENTAMICIN SULFATE 40 MG/ML IJ SOLN
600.0000 mg | INTRAVENOUS | Status: DC
  Filled 2012-12-31: qty 15

## 2013-01-01 ENCOUNTER — Encounter (HOSPITAL_COMMUNITY)
Admission: RE | Disposition: A | Discharge status: Home / Self Care | Payer: Self-pay | Source: Ambulatory Visit | Attending: Urology

## 2013-01-01 ENCOUNTER — Observation Stay (HOSPITAL_COMMUNITY)
Admission: RE | Admit: 2013-01-01 | Discharge: 2013-01-02 | Disposition: A | Discharge status: Home / Self Care | Payer: Self-pay | Source: Ambulatory Visit | Attending: Urology | Admitting: Urology

## 2013-01-01 ENCOUNTER — Encounter (HOSPITAL_COMMUNITY): Payer: Self-pay | Admitting: Anesthesiology

## 2013-01-01 ENCOUNTER — Ambulatory Visit (HOSPITAL_COMMUNITY): Payer: Self-pay | Admitting: Anesthesiology

## 2013-01-01 ENCOUNTER — Encounter (HOSPITAL_COMMUNITY): Payer: Self-pay | Admitting: *Deleted

## 2013-01-01 DIAGNOSIS — I10 Essential (primary) hypertension: Secondary | ICD-10-CM | POA: Insufficient documentation

## 2013-01-01 DIAGNOSIS — N200 Calculus of kidney: Principal | ICD-10-CM | POA: Insufficient documentation

## 2013-01-01 DIAGNOSIS — J45909 Unspecified asthma, uncomplicated: Secondary | ICD-10-CM | POA: Insufficient documentation

## 2013-01-01 DIAGNOSIS — G43909 Migraine, unspecified, not intractable, without status migrainosus: Secondary | ICD-10-CM | POA: Insufficient documentation

## 2013-01-01 HISTORY — PX: CYSTOSCOPY WITH RETROGRADE PYELOGRAM, URETEROSCOPY AND STENT PLACEMENT: SHX5789

## 2013-01-01 HISTORY — PX: HOLMIUM LASER APPLICATION: SHX5852

## 2013-01-01 SURGERY — CYSTOURETEROSCOPY, WITH RETROGRADE PYELOGRAM AND STENT INSERTION
Anesthesia: General | Laterality: Bilateral | Wound class: Clean Contaminated

## 2013-01-01 MED ORDER — PROPOFOL 10 MG/ML IV BOLUS
INTRAVENOUS | Status: DC | PRN
  Administered 2013-01-01: 200 mg via INTRAVENOUS

## 2013-01-01 MED ORDER — SODIUM CHLORIDE 0.9 % IR SOLN
Status: DC | PRN
  Administered 2013-01-01: 4000 mL

## 2013-01-01 MED ORDER — LIDOCAINE HCL (CARDIAC) 20 MG/ML IV SOLN
INTRAVENOUS | Status: DC | PRN
  Administered 2013-01-01: 100 mg via INTRAVENOUS

## 2013-01-01 MED ORDER — PROMETHAZINE HCL 25 MG/ML IJ SOLN: 6.2500 mg | INTRAMUSCULAR | Status: DC | PRN

## 2013-01-01 MED ORDER — OXYCODONE HCL 5 MG PO TABS
5.0000 mg | ORAL_TABLET | ORAL | Status: DC | PRN
  Administered 2013-01-01 – 2013-01-02 (×5): 5 mg via ORAL
  Filled 2013-01-01 (×6): qty 1

## 2013-01-01 MED ORDER — DEXAMETHASONE SODIUM PHOSPHATE 10 MG/ML IJ SOLN
INTRAMUSCULAR | Status: DC | PRN
  Administered 2013-01-01: 10 mg via INTRAVENOUS

## 2013-01-01 MED ORDER — BELLADONNA ALKALOIDS-OPIUM 16.2-60 MG RE SUPP
1.0000 | Freq: Four times a day (QID) | RECTAL | Status: DC | PRN
  Administered 2013-01-01: 1 via RECTAL
  Filled 2013-01-01: qty 1

## 2013-01-01 MED ORDER — SENNA 8.6 MG PO TABS
1.0000 | ORAL_TABLET | Freq: Two times a day (BID) | ORAL | Status: DC
  Administered 2013-01-01 – 2013-01-02 (×3): 8.6 mg via ORAL
  Filled 2013-01-01 (×3): qty 1

## 2013-01-01 MED ORDER — LACTATED RINGERS IV SOLN: INTRAVENOUS | Status: DC

## 2013-01-01 MED ORDER — GENTAMICIN SULFATE 40 MG/ML IJ SOLN
600.0000 mg | INTRAVENOUS | Status: AC
  Administered 2013-01-01: 600 mg via INTRAVENOUS
  Filled 2013-01-01: qty 15

## 2013-01-01 MED ORDER — HYDROMORPHONE HCL PF 1 MG/ML IJ SOLN: 0.2500 mg | INTRAMUSCULAR | Status: DC | PRN

## 2013-01-01 MED ORDER — KCL IN DEXTROSE-NACL 20-5-0.45 MEQ/L-%-% IV SOLN: INTRAVENOUS | Status: DC

## 2013-01-01 MED ORDER — LIDOCAINE HCL 2 % EX GEL
CUTANEOUS | Status: AC
  Filled 2013-01-01: qty 10

## 2013-01-01 MED ORDER — FENTANYL CITRATE 0.05 MG/ML IJ SOLN
INTRAMUSCULAR | Status: DC | PRN
  Administered 2013-01-01 (×4): 50 ug via INTRAVENOUS
  Administered 2013-01-01: 100 ug via INTRAVENOUS
  Administered 2013-01-01: 50 ug via INTRAVENOUS

## 2013-01-01 MED ORDER — MIDAZOLAM HCL 5 MG/5ML IJ SOLN
INTRAMUSCULAR | Status: DC | PRN
  Administered 2013-01-01: 2 mg via INTRAVENOUS

## 2013-01-01 MED ORDER — ONDANSETRON HCL 4 MG/2ML IJ SOLN
INTRAMUSCULAR | Status: DC | PRN
  Administered 2013-01-01: 4 mg via INTRAVENOUS

## 2013-01-01 MED ORDER — LACTATED RINGERS IV SOLN
INTRAVENOUS | Status: DC | PRN
  Administered 2013-01-01 (×2): via INTRAVENOUS

## 2013-01-01 MED ORDER — HYDROMORPHONE HCL PF 1 MG/ML IJ SOLN
0.5000 mg | INTRAMUSCULAR | Status: DC | PRN
  Filled 2013-01-01: qty 1

## 2013-01-01 MED ORDER — 0.9 % SODIUM CHLORIDE (POUR BTL) OPTIME
TOPICAL | Status: DC | PRN
  Administered 2013-01-01: 1000 mL

## 2013-01-01 MED ORDER — IOHEXOL 300 MG/ML  SOLN
INTRAMUSCULAR | Status: DC | PRN
  Administered 2013-01-01: 10 mL

## 2013-01-01 MED ORDER — DOCUSATE SODIUM 100 MG PO CAPS
100.0000 mg | ORAL_CAPSULE | Freq: Two times a day (BID) | ORAL | Status: DC
  Administered 2013-01-01 – 2013-01-02 (×3): 100 mg via ORAL
  Filled 2013-01-01 (×4): qty 1

## 2013-01-01 SURGICAL SUPPLY — 23 items
BAG URINE DRAINAGE (UROLOGICAL SUPPLIES) IMPLANT
BASKET LASER NITINOL 1.9FR (BASKET) ×2 IMPLANT
BASKET STNLS GEMINI 4WIRE 3FR (BASKET) IMPLANT
BASKET ZERO TIP NITINOL 2.4FR (BASKET) IMPLANT
BENZOIN TINCTURE PRP APPL 2/3 (GAUZE/BANDAGES/DRESSINGS) ×2 IMPLANT
CATH INTERMIT  6FR 70CM (CATHETERS) ×2 IMPLANT
DRAPE CAMERA CLOSED 9X96 (DRAPES) ×2 IMPLANT
DRSG TEGADERM 2-3/8X2-3/4 SM (GAUZE/BANDAGES/DRESSINGS) ×4 IMPLANT
ELECT REM PT RETURN 9FT ADLT (ELECTROSURGICAL)
ELECTRODE REM PT RTRN 9FT ADLT (ELECTROSURGICAL) IMPLANT
FIBER LASER FLEXIVA 200 (UROLOGICAL SUPPLIES) ×2 IMPLANT
FIBER LASER FLEXIVA 365 (UROLOGICAL SUPPLIES) IMPLANT
GLOVE BIOGEL M STRL SZ7.5 (GLOVE) ×2 IMPLANT
GOWN PREVENTION PLUS LG XLONG (DISPOSABLE) ×2 IMPLANT
GUIDEWIRE ANG ZIPWIRE 038X150 (WIRE) ×2 IMPLANT
GUIDEWIRE STR DUAL SENSOR (WIRE) ×2 IMPLANT
IV NS IRRIG 3000ML ARTHROMATIC (IV SOLUTION) ×2 IMPLANT
PACK CYSTO (CUSTOM PROCEDURE TRAY) ×2 IMPLANT
STENT POLARIS 5FRX26 (STENTS) ×4 IMPLANT
SYR 30ML LL (SYRINGE) ×2 IMPLANT
SYRINGE 10CC LL (SYRINGE) IMPLANT
SYRINGE IRR TOOMEY STRL 70CC (SYRINGE) IMPLANT
TUBE FEEDING 8FR 16IN STR KANG (MISCELLANEOUS) ×2 IMPLANT

## 2013-01-01 NOTE — Progress Notes (Signed)
On arrival to PACU, pt trying to get out of bed, pulled IV out, pulled off O2 mask, pt reassured and able to get pt to stay in bed

## 2013-01-01 NOTE — Brief Op Note (Signed)
01/01/2013  9:46 AM  PATIENT:  Keith Crane  32 y.o. male  PRE-OPERATIVE DIAGNOSIS:  BILATERAL RENAL STONES  POST-OPERATIVE DIAGNOSIS:  BILATERAL RENAL STONES  PROCEDURE:  Procedure(s): CYSTOSCOPY WITH RETROGRADE PYELOGRAM, URETEROSCOPY AND STENT PLACEMENT   WITH STENT CHANGE (Bilateral) HOLMIUM LASER APPLICATION (Bilateral)  SURGEON:  Surgeon(s) and Role:    * Sebastian Ache, MD - Primary  PHYSICIAN ASSISTANT:   ASSISTANTS: none   ANESTHESIA:   general  EBL:  Total I/O In: 1000 [I.V.:1000] Out: -   BLOOD ADMINISTERED:none  DRAINS: none   LOCAL MEDICATIONS USED:  NONE  SPECIMEN:  Source of Specimen:  Bilateral Renal Stones   DISPOSITION OF SPECIMEN:  [facility] for compositional analysis  COUNTS:  YES  TOURNIQUET:  * No tourniquets in log *  DICTATION: .Other Dictation: Dictation Number [id number]  PLAN OF CARE: Admit for overnight observation  PATIENT DISPOSITION:  PACU - hemodynamically stable.   Delay start of Pharmacological VTE agent (>24hrs) due to surgical blood loss or risk of bleeding: not applicable

## 2013-01-01 NOTE — Progress Notes (Signed)
Pt very insistent on getting up to BR and out of PACU; call to nsg supervisor and able to get pt room upstairs, pt stable from PACU standpoint

## 2013-01-01 NOTE — Transfer of Care (Signed)
Immediate Anesthesia Transfer of Care Note  Patient: Keith Crane  Procedure(s) Performed: Procedure(s): CYSTOSCOPY WITH RETROGRADE PYELOGRAM, URETEROSCOPY AND STENT PLACEMENT   WITH STENT CHANGE (Bilateral) HOLMIUM LASER APPLICATION (Bilateral)  Patient Location: PACU  Anesthesia Type:General  Level of Consciousness: sedated  Airway & Oxygen Therapy: Patient Spontanous Breathing and Patient connected to face mask oxygen  Post-op Assessment: Report given to PACU RN and Post -op Vital signs reviewed and stable  Post vital signs: Reviewed and stable  Complications: No apparent anesthesia complications

## 2013-01-01 NOTE — H&P (Signed)
Keith Crane is an 32 y.o. male.    Chief Complaint: Pre-op Bilateral Ureteroscopic Stone Manipulation  HPI:   1 - Left > Rt Nephrolithiasis with Refractory Flank Pain - Pt with first episode renal colic with left 10mm UPJ stone (mod hydro) and rt 3mm mid stone (non-obstructing) by ER CT 12/08/12. Symptoms refractory. Underwent cysto bilateral retrogrades and bilateral ureteral stenting 10/5 to allow for passive dilation and symptom control with plan for more definitive bilateral ureteroscopic stone manipulation in several weeks that is to occur today. No interval fevers. He has had some stent colic as expected.   Today Keith Crane is seen to proceed with re-attempt bilateral ureteroscopic stone manipulation.    Past Medical History  Diagnosis Date  . Anxiety   . Migraine   . Hypertension     has been borderline.  . Asthma     childhood  . History of kidney stones     first episode 12-08-12  . 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 Cystoscopy w/ ureteral stent placement Bilateral 12/08/2012    Procedure: CYSTOSCOPY WITH RETROGRADE PYELOGRAM/URETERAL STENT PLACEMENT, left diagnostic ureteroscopy;  Surgeon: Sebastian Ache, MD;  Location: [facility];  Service: Urology;  Laterality: Bilateral;    No family history on file. Social History:  reports that he has been smoking Cigarettes.  He has a .75 pack-year smoking history. He does not have any smokeless tobacco history on file. He reports that he drinks alcohol. He reports that he uses illicit drugs (Marijuana).  Allergies:  Allergies  Allergen Reactions  . Penicillins Anaphylaxis    No prescriptions prior to admission    No results found for this or any previous visit (from the past 48 hour(s)). No results found.  Review of Systems  Constitutional: Negative.  Negative for fever and chills.  HENT: Negative.   Eyes: Negative.   Respiratory: Negative.    Cardiovascular: Negative.   Gastrointestinal: Negative.   Genitourinary: Positive for urgency.  Musculoskeletal: Negative.   Skin: Negative.   Neurological: Negative.   Endo/Heme/Allergies: Negative.   Psychiatric/Behavioral: Negative.     There were no vitals taken for this visit. Physical Exam  Constitutional: He is oriented to person, place, and time. He appears well-developed and well-nourished.  HENT:  Head: Normocephalic and atraumatic.  Eyes: EOM are normal. Pupils are equal, round, and reactive to light.  Neck: Normal range of motion. Neck supple.  Cardiovascular: Normal rate.   Respiratory: Effort normal.  GI: Soft.  Obesity limits sensitivity of exam  Genitourinary: Penis normal.  No CVAT  Musculoskeletal: Normal range of motion.  Neurological: He is alert and oriented to person, place, and time.  Skin: Skin is warm and dry.  Psychiatric: He has a normal mood and affect. His behavior is normal. Judgment and thought content normal.     Assessment/Plan  1 - Left > Rt Nephrolithiasis with Refractory Flank Pain - We rediscussed ureteroscopic stone manipulation with basketing and laser-lithotripsy in detail.  We rediscussed risks including bleeding, infection, damage to kidney / ureter  bladder, rarely loss of kidney. We rediscussed anesthetic risks and rare but serious surgical complications including DVT, PE, MI, and mortality. We specifically readdressed that in 5-10% of cases a staged approach is required with stenting followed by re-attempt ureteroscopy if anatomy unfavorable. The patient voiced understanding and wises to proceed.   Keith Crane 01/01/2013, 4:48 AM

## 2013-01-01 NOTE — Care Management Note (Signed)
    Page 1 of 1   01/01/2013     4:15:34 PM   CARE MANAGEMENT NOTE 01/01/2013  Patient:  Keith Crane, Keith Crane   Account Number:  [id number]  Date Initiated:  01/01/2013  Documentation initiated by:  Lanier Clam  Subjective/Objective Assessment:   32 Y/O M ADMITTED W/URETERAL STONE.     Action/Plan:   FROM HOME.   Anticipated DC Date:  01/02/2013   Anticipated DC Plan:  HOME/SELF CARE      DC Planning Services  CM consult      Choice offered to / List presented to:             Status of service:  In process, will continue to follow Medicare Important Message given?   (If response is "NO", the following Medicare IM given date fields will be blank) Date Medicare IM given:   Date Additional Medicare IM given:    Discharge Disposition:    Per UR Regulation:  Reviewed for med. necessity/level of care/duration of stay  If discussed at Long Length of Stay Meetings, dates discussed:    Comments:  01/01/13 Paarth Cropper RN,BSN NCM [phone] S/P CYSTOSCOPY.

## 2013-01-01 NOTE — Anesthesia Preprocedure Evaluation (Addendum)
Anesthesia Evaluation  Patient identified by MRN, date of birth, ID band Patient awake    Reviewed: Allergy & Precautions, H&P , NPO status , Patient's Chart, lab work & pertinent test results, reviewed documented beta blocker date and time   Airway Mallampati: IV TM Distance: >3 FB Neck ROM: full   Comment: Opens well, Midline trach, plenty thyromental distance. Dental no notable dental hx. (+) Teeth Intact and Dental Advisory Given   Pulmonary asthma , Current Smoker,  breath sounds clear to auscultation  Pulmonary exam normal       Cardiovascular hypertension (No current meds, not greatly increased today), Pt. on medications Rhythm:regular Rate:Normal     Neuro/Psych  Headaches, negative psych ROS   GI/Hepatic negative GI ROS, Neg liver ROS,   Endo/Other  Morbid obesity  Renal/GU negative Renal ROS  negative genitourinary   Musculoskeletal negative musculoskeletal ROS (+)   Abdominal   Peds negative pediatric ROS (+)  Hematology negative hematology ROS (+)   Anesthesia Other Findings   Reproductive/Obstetrics                          Anesthesia Physical Anesthesia Plan  ASA: III  Anesthesia Plan: General   Post-op Pain Management:    Induction: Intravenous  Airway Management Planned: Oral ETT  Additional Equipment:   Intra-op Plan:   Post-operative Plan: Extubation in OR  Informed Consent: I have reviewed the patients History and Physical, chart, labs and discussed the procedure including the risks, benefits and alternatives for the proposed anesthesia with the patient or authorized representative who has indicated his/her understanding and acceptance.   Dental advisory given  Plan Discussed with: CRNA  Anesthesia Plan Comments:         Anesthesia Quick Evaluation

## 2013-01-01 NOTE — Progress Notes (Signed)
Dr. Berneice Heinrich was informed that patient wants to spend the night

## 2013-01-01 NOTE — Anesthesia Postprocedure Evaluation (Signed)
Anesthesia Post Note  Patient: Keith Crane  Procedure(s) Performed: Procedure(s) (LRB): CYSTOSCOPY WITH RETROGRADE PYELOGRAM, URETEROSCOPY AND STENT PLACEMENT   WITH STENT CHANGE (Bilateral) HOLMIUM LASER APPLICATION (Bilateral)  Anesthesia type: General  Patient location: PACU  Post pain: Pain level controlled  Post assessment: Post-op Vital signs reviewed  Last Vitals:  Filed Vitals:   01/01/13 1037  BP: 165/61  Pulse: 74  Temp: 36.6 C  Resp: 16    Post vital signs: Reviewed  Level of consciousness: sedated  Complications: No apparent anesthesia complications

## 2013-01-01 NOTE — OR Nursing (Signed)
Renal stones sent with Dr. Berneice Heinrich.

## 2013-01-02 ENCOUNTER — Encounter (HOSPITAL_COMMUNITY): Payer: Self-pay | Admitting: Urology

## 2013-01-02 MED ORDER — SENNOSIDES-DOCUSATE SODIUM 8.6-50 MG PO TABS: 1.0000 | ORAL_TABLET | Freq: Two times a day (BID) | ORAL | Status: DC

## 2013-01-02 MED ORDER — CIPROFLOXACIN HCL 500 MG PO TABS: 500.0000 mg | ORAL_TABLET | Freq: Two times a day (BID) | ORAL | Status: DC

## 2013-01-02 MED ORDER — OXYCODONE-ACETAMINOPHEN 5-325 MG PO TABS: 1.0000 | ORAL_TABLET | ORAL | Status: DC | PRN

## 2013-01-02 NOTE — Op Note (Signed)
NAMEGARRETH, BURNSWORTH NO.:  [id number]  MEDICAL RECORD NO.:  [id number]  LOCATION:  1419                         FACILITY:  [facility]  PHYSICIAN:  Sebastian Ache, MD     DATE OF BIRTH:  16-Jun-1980  DATE OF PROCEDURE:01/01/2013 DATE OF DISCHARGE:                              OPERATIVE REPORT   DIAGNOSIS:  Left greater than right renal stones, flank pain.  PROCEDURE: 1. Cystoscopy with bilateral retrograde pyelograms, interpretation,     exchange of bilateral ureteral stents 5 x 26 Polaris with tether. 2. Left ureteroscopy with laser lithotripsy. 3. Right ureteroscopy with basketing of stones.  ESTIMATED BLOOD LOSS:  Nil.  COMPLICATIONS:  None.  SPECIMEN:  Bilateral renal stone for compositional analysis.  FINDINGS: 1. Left renal pelvis approximately 1 cm stone. 2. Unremarkable left retrograde pyelogram. 3. Right proximal ureteral approximately 3 mm stone.  INDICATION:  Mr. Serna is a morbidly obese pleasant 32 year old gentleman with history of first episode of renal colic approximately 1 month ago.  He underwent bilateral ureteral stenting at that time to allow for passive dilation as a bridge to definitive ureteroscopy which he presents for today.  His most recent urine was without infectious parameters.  Informed consent was obtained and placed in medical record.  PROCEDURE IN DETAIL:  The patient being [name], procedure being bilateral ureteroscopic stone manipulation was confirmed.  Procedure was carried out.  Time-out was performed.  Intravenous antibiotics were administered.  General LMA anesthesia was introduced.  The patient was placed into a low lithotomy position.  Sterile field was created by prepping and draping the patient's penis, perineum, and proximal thighs using iodine x3.  Next, cystourethroscopy was performed using a 22- French rigid cystoscope with 12-degree offset lens.  Inspection of the anterior and posterior urethra  unremarkable.  The distal end of the left ureteral stent was grasped, brought to the level of urethral meatus through which a 0.038 Glidewire was advanced at the level of the upper pole.  The stent was removed and semi-rigid ureteroscopy was performed in the distal 1/2 of the left ureter alongside a separate Sensor working wire and an 8-French feeding tube in urinary bladder for pressure release that revealed no mucosal abnormalities or calcifications of the distal ureter.  Next, a semi-rigid ureteroscope was exchanged for a 12/14 36 cm ureteral access sheath using continuous fluoroscopic vision to the level of the mid ureter.  Next, flexible digital ureteroscopy was performed of the proximal left ureter and entire left kidney using a 6- [place] digital ureteroscope.  This revealed a large multifaceted approximately 1-cm stone in the renal pelvis.  This appeared to be much too large for basketing, as such holmium laser energy was applied to the stone using a 200 nanometer fiber at settings of 0.6 joules and 6 Hz ablating the stones into approximately 8 smaller fragments.  These were then grasped with an escape basket and brought out in their entirety, set aside for compositional analysis.  Repeat inspection revealed complete removal of all stones larger than 1/3rd mm.  No evidence of perforation.  There was excellent hemostasis.  The sheath was removed under continuous ureteroscopic vision and no mucosal abnormalities were found.  Finally, a new 5 x 26 Polaris stent was placed on the left side over the remaining safety wire using cystoscopic and fluoroscopic guidance.  Good proximal and distal deployment were noted.  Tether was left in place.  Attention was then directed to the right side.  The distal end of the right stent was brought to the level of the urethral meatus through which a 0.038 Glidewire was advanced at the level of the upper pole and the stent was exchanged for the  semi-rigid ureteroscope, which allowed inspection of the distal one-half of the right ureter. Again, an 8-French feeding tube in the urinary bladder for pressure release, which revealed no mucosal abnormalities or calcifications in the distal ureter.  Next, a semi-rigid ureteroscope was exchanged for a 36 cm 12/14 ureteral access sheath, which was placed under continuous ureteroscopic vision over the sensor working wire to the level of mid ureter.  Next, flexible digital ureteroscopy was performed of the right ureter and entire right kidney.  This revealed a small approximately 3 to 4 mm stone in the proximal ureter consistent with known stone.  This was grasped and brought out in its entirety, set aside for compositional analysis.  Repeat inspection revealed complete resolution of all stone fragments, no evidence of perforation.  There was excellent hemostasis. The sheath was removed under continuous ureteroscopic vision and no mucosal abnormalities or calcifications were seen.  Finally a new 5 x 26 Polaris stent was placed with remaining safety wire on the right side. Good proximal and distal deployment were noted.  Tether was left in place and both the left and right tether were fashioned to the thigh. Bladder was emptied per cystoscope.  Procedure was then terminated.  The patient tolerated the procedure well.  There were no immediate periprocedural complications.  The patient was taken to the postanesthesia care unit in a stable condition.          ______________________________ Sebastian Ache, MD     TM/MEDQ  D:  01/01/2013  T:  01/02/2013  Job:  [id number]

## 2013-01-02 NOTE — Discharge Summary (Signed)
Physician Discharge Summary  Patient ID: Keith Crane MRN: [id number] DOB/AGE: June 22, 1980 32 y.o.  Admit date: 01/01/2013 Discharge date: 01/02/2013  Admission Diagnoses: Bilateral Nephrolithiasis  Discharge Diagnoses: Bilateral Nephrolithiasis Active Problems:   * No active hospital problems. *   Discharged Condition: good  Hospital Course:   1 - Left > Rt Nephrolithiasis with Refractory Flank Pain - Pt with first episode renal colic with left 10mm UPJ stone (mod hydro) and rt 3mm mid stone (non-obstructing) by ER CT 12/08/12. Symptoms refractory. Underwent cysto bilateral retrogrades and bilateral ureteral stenting 10/5 to allow for passive dilation and symptom control with plan for more definitive bilateral ureteroscopic stone manipulation in several weeks. He underwent elective bilateral ureteroscopy with stone manipualtion and stent exchange (tethered) on 01/01/13 without acute complications. He was observed overnight as he had no family to stay with him after general anesthesia.    Today Geocory is without complaints. Afebrile and voiding well. Pain controlled.    Consults: None  Significant Diagnostic Studies: labs: stone composition - pendign  Treatments: surgery:  bilateral ureteroscopy with stone manipualtion and stent exchange (tethered) on 01/01/13   Discharge Exam: Blood pressure 139/73, pulse 61, temperature 98 F (36.7 C), temperature source Oral, resp. rate 20, height 6' (1.829 m), weight 181.94 kg (401 lb 1.7 oz), SpO2 98.00%. General appearance: alert, cooperative, appears stated age and morbidly obese Head: Normocephalic, without obvious abnormality, atraumatic Eyes: conjunctivae/corneas clear. PERRL, EOM's intact. Fundi benign. Ears: normal TM's and external ear canals both ears Nose: Nares normal. Septum midline. Mucosa normal. No drainage or sinus tenderness. Throat: lips, mucosa, and tongue normal; teeth and gums normal Neck: no adenopathy, no carotid  bruit, no JVD, supple, symmetrical, trachea midline and thyroid not enlarged, symmetric, no tenderness/mass/nodules Back: symmetric, no curvature. ROM normal. No CVA tenderness. Resp: clear to auscultation bilaterally Chest wall: no tenderness Cardio: regular rate and rhythm, S1, S2 normal, no murmur, click, rub or gallop GI: soft, non-tender; bowel sounds normal; no masses,  no organomegaly Male genitalia: normal, bialteral stent tethers fastened to thigh. Completely buried penis. Extremities: extremities normal, atraumatic, no cyanosis or edema Pulses: 2+ and symmetric Skin: Skin color, texture, turgor normal. No rashes or lesions Lymph nodes: Cervical, supraclavicular, and axillary nodes normal. Neurologic: Grossly normal  Disposition: 01-Home or Self Care     Medication List         ciprofloxacin 500 MG tablet  Commonly known as:  CIPRO  Take 1 tablet (500 mg total) by mouth 2 (two) times daily. X 5 days to prevent infection.     oxyCODONE-acetaminophen 5-325 MG per tablet  Commonly known as:  ROXICET  Take 1 tablet by mouth every 4 (four) hours as needed for pain. Post-operativelyi.     senna-docusate 8.6-50 MG per tablet  Commonly known as:  Senokot-S  Take 1 tablet by mouth 2 (two) times daily. While taking pain meds to prevent constipation.           Follow-up Information   Follow up with Sebastian Ache, MD On 01/14/2013. (at 1:30 PM for MD visit.)    Specialty:  Urology   Contact information:   [address] [place] [address] [phone]       Signed: Sebastian Ache 01/02/2013, 8:14 AM

## 2013-02-28 ENCOUNTER — Encounter (HOSPITAL_COMMUNITY): Payer: Self-pay | Admitting: Emergency Medicine

## 2013-02-28 ENCOUNTER — Emergency Department (HOSPITAL_COMMUNITY)
Admission: EM | Admit: 2013-02-28 | Discharge: 2013-02-28 | Disposition: A | Discharge status: Home / Self Care | Payer: No Typology Code available for payment source | Attending: Emergency Medicine | Admitting: Emergency Medicine

## 2013-02-28 DIAGNOSIS — J45909 Unspecified asthma, uncomplicated: Secondary | ICD-10-CM | POA: Insufficient documentation

## 2013-02-28 DIAGNOSIS — M542 Cervicalgia: Secondary | ICD-10-CM | POA: Insufficient documentation

## 2013-02-28 DIAGNOSIS — F17200 Nicotine dependence, unspecified, uncomplicated: Secondary | ICD-10-CM | POA: Insufficient documentation

## 2013-02-28 DIAGNOSIS — IMO0001 Reserved for inherently not codable concepts without codable children: Secondary | ICD-10-CM | POA: Insufficient documentation

## 2013-02-28 DIAGNOSIS — Z880 Allergy status to penicillin: Secondary | ICD-10-CM | POA: Insufficient documentation

## 2013-02-28 DIAGNOSIS — J069 Acute upper respiratory infection, unspecified: Secondary | ICD-10-CM | POA: Insufficient documentation

## 2013-02-28 DIAGNOSIS — Z8659 Personal history of other mental and behavioral disorders: Secondary | ICD-10-CM | POA: Insufficient documentation

## 2013-02-28 DIAGNOSIS — I10 Essential (primary) hypertension: Secondary | ICD-10-CM | POA: Insufficient documentation

## 2013-02-28 DIAGNOSIS — R110 Nausea: Secondary | ICD-10-CM | POA: Insufficient documentation

## 2013-02-28 DIAGNOSIS — Z609 Problem related to social environment, unspecified: Secondary | ICD-10-CM | POA: Insufficient documentation

## 2013-02-28 DIAGNOSIS — Z87442 Personal history of urinary calculi: Secondary | ICD-10-CM | POA: Insufficient documentation

## 2013-02-28 DIAGNOSIS — Z8679 Personal history of other diseases of the circulatory system: Secondary | ICD-10-CM | POA: Insufficient documentation

## 2013-02-28 MED ORDER — HYDROCODONE-HOMATROPINE 5-1.5 MG/5ML PO SYRP: 5.0000 mL | ORAL_SOLUTION | Freq: Four times a day (QID) | ORAL | Status: DC | PRN

## 2013-02-28 MED ORDER — GUAIFENESIN 100 MG/5ML PO LIQD: 100.0000 mg | ORAL | Status: DC | PRN

## 2013-02-28 NOTE — ED Provider Notes (Signed)
CSN: [id number]     Arrival date & time 02/28/13  1457 History   First MD Initiated Contact with Patient 02/28/13 1651     This chart was scribed for Fayrene Helper by Ladona Ridgel Day, ED scribe. This patient was seen in room WTR6/WTR6 and the patient's care was started at 1615.  Chief Complaint  Patient presents with  . Fever  . Cough  . Nasal Congestion   Patient is a 32 y.o. male presenting with cough. The history is provided by the patient. No language interpreter was used.  Cough Associated symptoms: chills, headaches and rhinorrhea   Associated symptoms: no chest pain, no fever, no rash and no shortness of breath    HPI Comments: Keith Crane is a 32 y.o. male who presents to the Emergency Department w/hx of asthma (well controlled) complaining of constant, moderate, frontal HA, onset yesterday. He reports associated  Myalgias, right sided neck pain, rhinorrhea, nausea, chills, chest congestion and non productive cough. He never been hospitalized for his asthma since he was a young kid. He denies sore throat, sneezing, ear pain. He has tried tylenol and excedrin w/out any relief. He takes no regular medicines.  Past Medical History  Diagnosis Date  . Anxiety   . Migraine   . Hypertension     has been borderline.  . Asthma     childhood  . History of kidney stones     first episode 12-08-12  . 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 Cystoscopy w/ ureteral stent placement Bilateral 12/08/2012    Procedure: CYSTOSCOPY WITH RETROGRADE PYELOGRAM/URETERAL STENT PLACEMENT, left diagnostic ureteroscopy;  Surgeon: Sebastian Ache, MD;  Location: [facility];  Service: Urology;  Laterality: Bilateral;  . Cystoscopy with retrograde pyelogram, ureteroscopy and stent placement Bilateral 01/01/2013    Procedure: CYSTOSCOPY WITH RETROGRADE PYELOGRAM, URETEROSCOPY AND STENT PLACEMENT   WITH STENT CHANGE;  Surgeon: Sebastian Ache, MD;  Location: [facility];  Service: Urology;  Laterality: Bilateral;  . Holmium laser application Bilateral 01/01/2013    Procedure: HOLMIUM LASER APPLICATION;  Surgeon: Sebastian Ache, MD;  Location: [facility];  Service: Urology;  Laterality: Bilateral;   History reviewed. No pertinent family history. History  Substance Use Topics  . Smoking status: Current Every Day Smoker -- 0.25 packs/day for 3 years    Types: Cigarettes  . Smokeless tobacco: Not on file  . Alcohol Use: Yes     Comment: social-weekends    Review of Systems  Constitutional: Positive for chills. Negative for fever.  HENT: Positive for congestion and rhinorrhea.   Respiratory: Positive for cough. Negative for shortness of breath.   Cardiovascular: Negative for chest pain.  Gastrointestinal: Positive for nausea. Negative for vomiting, abdominal pain and diarrhea.  Musculoskeletal: Negative for back pain.  Skin: Negative for color change and rash.  Neurological: Positive for headaches. Negative for syncope.  All other systems reviewed and are negative.    Allergies  Penicillins  Home Medications   Current Outpatient Rx  Name  Route  Sig  Dispense  Refill  . guaiFENesin (ROBITUSSIN) 100 MG/5ML liquid   Oral   Take 5-10 mLs (100-200 mg total) by mouth every 4 (four) hours as needed for cough.   60 mL   0   . HYDROcodone-homatropine (HYCODAN) 5-1.5 MG/5ML syrup   Oral   Take 5 mLs by mouth every 6 (six) hours as needed for cough.  120 mL   0    Triage Vitals: BP 165/91  Pulse 72  Temp(Src) 99.7 F (37.6 C) (Oral)  Resp 20  SpO2 100% Physical Exam  Nursing note and vitals reviewed. Constitutional: He is oriented to person, place, and time. He appears well-developed and well-nourished. No distress.  Non-toxic appearance.  HENT:  Head: Normocephalic and atraumatic.  Mouth/Throat: No oropharyngeal exudate.  Mild rhinorrhea Uvula midline no tonsilar enlargement or exudates    Eyes:  Conjunctivae are normal. Right eye exhibits no discharge. Left eye exhibits no discharge.  Neck: Normal range of motion.  No  Lymphadenopathy No nuchal rigidity.   Cardiovascular: Normal rate and regular rhythm.   No murmur heard. Pulmonary/Chest: Effort normal and breath sounds normal. No respiratory distress. He has no wheezes. He has no rales.  Musculoskeletal: Normal range of motion. He exhibits no edema.  Lymphadenopathy:    He has no cervical adenopathy.  Neurological: He is alert and oriented to person, place, and time.  Skin: Skin is warm and dry.  Psychiatric: He has a normal mood and affect. Thought content normal.    ED Course  Procedures (including critical care time) DIAGNOSTIC STUDIES: Oxygen Saturation is 100% on room air, normal by my interpretation.    COORDINATION OF CARE: At 455 PM Discussed treatment plan with patient which includes pain medicine and robitussin. Patient agrees. sxs likely URI.    Labs Review Labs Reviewed - No data to display Imaging Review No results found.  EKG Interpretation   None       MDM   1. URI (upper respiratory infection)     BP 165/91  Pulse 72  Temp(Src) 99.7 F (37.6 C) (Oral)  Resp 20  SpO2 100%  I personally performed the services described in this documentation, which was scribed in my presence. The recorded information has been reviewed and is accurate.        Fayrene Helper, PA-C 02/28/13 1710

## 2013-02-28 NOTE — ED Notes (Signed)
Pt in c/o fever cough and body aches since yesterday, denies n/v, states he has not tried medication at home for symptoms

## 2013-03-01 NOTE — ED Provider Notes (Signed)
Medical screening examination/treatment/procedure(s) were performed by non-physician practitioner and as supervising physician I was immediately available for consultation/collaboration.  EKG Interpretation   None         Junius Argyle, MD 03/01/13 1114

## 2013-05-16 ENCOUNTER — Emergency Department (HOSPITAL_COMMUNITY)
Admission: EM | Admit: 2013-05-16 | Discharge: 2013-05-17 | Disposition: A | Discharge status: Home / Self Care | Payer: No Typology Code available for payment source | Attending: Emergency Medicine | Admitting: Emergency Medicine

## 2013-05-16 DIAGNOSIS — H53149 Visual discomfort, unspecified: Secondary | ICD-10-CM | POA: Insufficient documentation

## 2013-05-16 DIAGNOSIS — R519 Headache, unspecified: Secondary | ICD-10-CM

## 2013-05-16 DIAGNOSIS — I10 Essential (primary) hypertension: Secondary | ICD-10-CM | POA: Insufficient documentation

## 2013-05-16 DIAGNOSIS — R109 Unspecified abdominal pain: Secondary | ICD-10-CM | POA: Insufficient documentation

## 2013-05-16 DIAGNOSIS — F17200 Nicotine dependence, unspecified, uncomplicated: Secondary | ICD-10-CM | POA: Insufficient documentation

## 2013-05-16 DIAGNOSIS — Z8659 Personal history of other mental and behavioral disorders: Secondary | ICD-10-CM | POA: Insufficient documentation

## 2013-05-16 DIAGNOSIS — Z880 Allergy status to penicillin: Secondary | ICD-10-CM | POA: Insufficient documentation

## 2013-05-16 DIAGNOSIS — R112 Nausea with vomiting, unspecified: Secondary | ICD-10-CM | POA: Insufficient documentation

## 2013-05-16 DIAGNOSIS — R51 Headache: Secondary | ICD-10-CM | POA: Insufficient documentation

## 2013-05-16 DIAGNOSIS — Z87442 Personal history of urinary calculi: Secondary | ICD-10-CM | POA: Insufficient documentation

## 2013-05-16 DIAGNOSIS — J45909 Unspecified asthma, uncomplicated: Secondary | ICD-10-CM | POA: Insufficient documentation

## 2013-05-17 ENCOUNTER — Encounter (HOSPITAL_COMMUNITY): Payer: Self-pay | Admitting: Emergency Medicine

## 2013-05-17 MED ORDER — KETOROLAC TROMETHAMINE 60 MG/2ML IM SOLN
60.0000 mg | Freq: Once | INTRAMUSCULAR | Status: AC
  Administered 2013-05-17: 60 mg via INTRAMUSCULAR
  Filled 2013-05-17: qty 2

## 2013-05-17 MED ORDER — METOCLOPRAMIDE HCL 5 MG/ML IJ SOLN
10.0000 mg | Freq: Once | INTRAMUSCULAR | Status: AC
  Administered 2013-05-17: 10 mg via INTRAMUSCULAR
  Filled 2013-05-17: qty 2

## 2013-05-17 MED ORDER — DIPHENHYDRAMINE HCL 50 MG/ML IJ SOLN
25.0000 mg | Freq: Once | INTRAMUSCULAR | Status: AC
  Administered 2013-05-17: 25 mg via INTRAMUSCULAR
  Filled 2013-05-17: qty 1

## 2013-05-17 NOTE — ED Provider Notes (Signed)
CSN: [id number]     Arrival date & time 05/16/13  2333 History   First MD Initiated Contact with Patient 05/17/13 0114     Chief Complaint  Patient presents with  . Headache  . Abdominal Pain    (Consider location/radiation/quality/duration/timing/severity/associated sxs/prior Treatment) HPI Comments: Patient is a 33 year old male with history of migraine headaches, hypertension, and asthma who presents to the emergency department for a migraine headache. Patient states that headache was gradual in onset and is consistent with prior migraines. Patient has taken ibuprofen for symptoms without relief. He endorses symptoms being associated with for sporadic episodes of nonbloody/nonbilious emesis, photophobia, and phonophobia. Patient denies associated fever, vision changes or vision loss, tinnitus or hearing loss, difficulty speaking or swallowing, abdominal pain, diarrhea, numbness/tingling, and extremity weakness.  Patient is a 33 y.o. male presenting with headaches and abdominal pain. The history is provided by the patient. No language interpreter was used.  Headache Associated symptoms: nausea, photophobia and vomiting   Associated symptoms: no abdominal pain, no diarrhea, no fever and no numbness   Abdominal Pain Associated symptoms: nausea and vomiting   Associated symptoms: no constipation, no diarrhea, no dysuria, no fever and no hematuria     Past Medical History  Diagnosis Date  . Anxiety   . Migraine   . Hypertension     has been borderline.  . Asthma     childhood  . History of kidney stones     first episode 12-08-12  . 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 Cystoscopy w/ ureteral stent placement Bilateral 12/08/2012    Procedure: CYSTOSCOPY WITH RETROGRADE PYELOGRAM/URETERAL STENT PLACEMENT, left diagnostic ureteroscopy;  Surgeon: Sebastian Acheheodore Manny, MD;  Location: [facility];  Service: Urology;   Laterality: Bilateral;  . Cystoscopy with retrograde pyelogram, ureteroscopy and stent placement Bilateral 01/01/2013    Procedure: CYSTOSCOPY WITH RETROGRADE PYELOGRAM, URETEROSCOPY AND STENT PLACEMENT   WITH STENT CHANGE;  Surgeon: Sebastian Acheheodore Manny, MD;  Location: [facility];  Service: Urology;  Laterality: Bilateral;  . Holmium laser application Bilateral 01/01/2013    Procedure: HOLMIUM LASER APPLICATION;  Surgeon: Sebastian Acheheodore Manny, MD;  Location: [facility];  Service: Urology;  Laterality: Bilateral;   History reviewed. No pertinent family history. History  Substance Use Topics  . Smoking status: Current Every Day Smoker -- 0.25 packs/day for 3 years    Types: Cigarettes  . Smokeless tobacco: Not on file  . Alcohol Use: Yes     Comment: social-weekends    Review of Systems  Constitutional: Negative for fever.  Eyes: Positive for photophobia.  Gastrointestinal: Positive for nausea and vomiting. Negative for abdominal pain, diarrhea and constipation.  Genitourinary: Negative for dysuria and hematuria.  Neurological: Positive for headaches. Negative for syncope, weakness and numbness.  All other systems reviewed and are negative.     Allergies  Penicillins  Home Medications  No current outpatient prescriptions on file. BP 131/58  Pulse 92  Temp(Src) 98.2 F (36.8 C) (Oral)  Resp 22  Ht 6' (1.829 m)  Wt 386 lb 5 oz (175.23 kg)  BMI 52.38 kg/m2  SpO2 97%  Physical Exam  Nursing note and vitals reviewed. Constitutional: He is oriented to person, place, and time. He appears well-developed and well-nourished. No distress.  HENT:  Head: Normocephalic and atraumatic.  Mouth/Throat: Oropharynx is clear and moist. No oropharyngeal exudate.  Eyes: Conjunctivae and EOM are normal. Pupils are equal, round, and  reactive to light. No scleral icterus.  Neck: Normal range of motion. Neck supple.  No nuchal rigidity or meningismus  Cardiovascular: Normal rate, regular rhythm and normal heart  sounds.   Pulmonary/Chest: Effort normal. No respiratory distress. He has no wheezes. He has no rales.  Abdominal: Soft. There is no tenderness. There is no rebound and no guarding.  Musculoskeletal: Normal range of motion.  Neurological: He is alert and oriented to person, place, and time. He has normal reflexes. No cranial nerve deficit.  GCS 15. Speech is goal oriented. No cranial nerve deficits appreciated; symmetric eyebrow raise, no facial drooping, and equal tongue protrusion. Patient has normal and equal grip strength bilaterally with 5/5 strength against resistance in all extremities. No gross sensory deficits appreciated. Patellar and Achilles reflexes 2+ bilaterally. He ambulates with normal gait.   Skin: Skin is warm and dry. No rash noted. He is not diaphoretic. No erythema. No pallor.  Psychiatric: He has a normal mood and affect. His behavior is normal.    ED Course  Procedures (including critical care time) Labs Review Labs Reviewed - No data to display Imaging Review No results found.   EKG Interpretation None      MDM   Final diagnoses:  Headache   33 y/o male presents for headache consistent with prior migraines. He denies thunderclap onset of symptoms. Symptoms have been ongoing for 1 day. Patient has a normal neurologic exam today; no focal neurologic deficits appreciated. Doubt acute emergent intracranial process in this patient given reassuring exam today as well as the fact that symptoms have been consistent with prior headaches. Will tx with migraine cocktail and reassess.  Patient states that headache has improved from 9/10 to 5/10. Serial abdominal examinations stable without tenderness. He has eaten a sandwich and drank sprite without difficulty or emesis. He states that he feels as though he can manage his symptoms further at home. Do not believe further emergent workup is indicated today. Patient stable and appropriate for discharge with instruction to  return should symptoms worsen as well as to rest and sleep for at least 5-7 hours uninterrupted. Patient agreeable to plan with no unaddressed concerns.   Filed Vitals:   05/16/13 2356  BP: 131/58  Pulse: 92  Temp: 98.2 F (36.8 C)  TempSrc: Oral  Resp: 22  Height: 6' (1.829 m)  Weight: 386 lb 5 oz (175.23 kg)  SpO2: 97%      Antony Madura, PA-C 05/17/13 [phone]

## 2013-05-17 NOTE — ED Provider Notes (Signed)
Medical screening examination/treatment/procedure(s) were performed by non-physician practitioner and as supervising physician I was immediately available for consultation/collaboration.   EKG Interpretation None       Monie Shere M Andreah Goheen, MD 05/17/13 0722 

## 2013-05-17 NOTE — Discharge Instructions (Signed)

## 2013-05-17 NOTE — ED Notes (Signed)
Pt states he began having headache and vomiting today,  Headache 9/10  And stomach /10,  Denies diarrhea.  Denies any recent illness

## 2013-06-02 ENCOUNTER — Emergency Department (HOSPITAL_COMMUNITY)
Admission: EM | Admit: 2013-06-02 | Discharge: 2013-06-02 | Disposition: A | Discharge status: Home / Self Care | Payer: Self-pay | Attending: Emergency Medicine | Admitting: Emergency Medicine

## 2013-06-02 ENCOUNTER — Emergency Department (HOSPITAL_COMMUNITY): Payer: Self-pay

## 2013-06-02 ENCOUNTER — Encounter (HOSPITAL_COMMUNITY): Payer: Self-pay | Admitting: Emergency Medicine

## 2013-06-02 DIAGNOSIS — R143 Flatulence: Secondary | ICD-10-CM

## 2013-06-02 DIAGNOSIS — R112 Nausea with vomiting, unspecified: Secondary | ICD-10-CM | POA: Insufficient documentation

## 2013-06-02 DIAGNOSIS — R142 Eructation: Secondary | ICD-10-CM | POA: Insufficient documentation

## 2013-06-02 DIAGNOSIS — R42 Dizziness and giddiness: Secondary | ICD-10-CM | POA: Insufficient documentation

## 2013-06-02 DIAGNOSIS — R51 Headache: Secondary | ICD-10-CM | POA: Insufficient documentation

## 2013-06-02 DIAGNOSIS — Z880 Allergy status to penicillin: Secondary | ICD-10-CM | POA: Insufficient documentation

## 2013-06-02 DIAGNOSIS — R141 Gas pain: Secondary | ICD-10-CM | POA: Insufficient documentation

## 2013-06-02 DIAGNOSIS — Z87442 Personal history of urinary calculi: Secondary | ICD-10-CM | POA: Insufficient documentation

## 2013-06-02 DIAGNOSIS — F419 Anxiety disorder, unspecified: Secondary | ICD-10-CM

## 2013-06-02 DIAGNOSIS — F17200 Nicotine dependence, unspecified, uncomplicated: Secondary | ICD-10-CM | POA: Insufficient documentation

## 2013-06-02 DIAGNOSIS — Z609 Problem related to social environment, unspecified: Secondary | ICD-10-CM | POA: Insufficient documentation

## 2013-06-02 DIAGNOSIS — J45901 Unspecified asthma with (acute) exacerbation: Secondary | ICD-10-CM | POA: Insufficient documentation

## 2013-06-02 DIAGNOSIS — Z8679 Personal history of other diseases of the circulatory system: Secondary | ICD-10-CM | POA: Insufficient documentation

## 2013-06-02 DIAGNOSIS — F410 Panic disorder [episodic paroxysmal anxiety] without agoraphobia: Secondary | ICD-10-CM | POA: Insufficient documentation

## 2013-06-02 DIAGNOSIS — R079 Chest pain, unspecified: Secondary | ICD-10-CM

## 2013-06-02 LAB — CBC WITH DIFFERENTIAL/PLATELET
BASOS PCT: 0 % (ref 0–1)
Basophils Absolute: 0 10*3/uL (ref 0.0–0.1)
EOS ABS: 0.2 10*3/uL (ref 0.0–0.7)
EOS PCT: 3 % (ref 0–5)
HCT: 42.5 % (ref 39.0–52.0)
HEMOGLOBIN: 14.2 g/dL (ref 13.0–17.0)
LYMPHS ABS: 1.6 10*3/uL (ref 0.7–4.0)
Lymphocytes Relative: 31 % (ref 12–46)
MCH: 28.2 pg (ref 26.0–34.0)
MCHC: 33.4 g/dL (ref 30.0–36.0)
MCV: 84.5 fL (ref 78.0–100.0)
MONO ABS: 0.3 10*3/uL (ref 0.1–1.0)
MONOS PCT: 5 % (ref 3–12)
Neutro Abs: 3.1 10*3/uL (ref 1.7–7.7)
Neutrophils Relative %: 61 % (ref 43–77)
Platelets: 240 10*3/uL (ref 150–400)
RBC: 5.03 MIL/uL (ref 4.22–5.81)
RDW: 13.1 % (ref 11.5–15.5)
WBC: 5.2 10*3/uL (ref 4.0–10.5)

## 2013-06-02 LAB — I-STAT CHEM 8, ED
BUN: 6 mg/dL (ref 6–23)
CALCIUM ION: 1.13 mmol/L (ref 1.12–1.23)
CHLORIDE: 105 meq/L (ref 96–112)
CREATININE: 1.1 mg/dL (ref 0.50–1.35)
GLUCOSE: 103 mg/dL — AB (ref 70–99)
HCT: 44 % (ref 39.0–52.0)
Hemoglobin: 15 g/dL (ref 13.0–17.0)
Potassium: 3.5 mEq/L — ABNORMAL LOW (ref 3.7–5.3)
Sodium: 143 mEq/L (ref 137–147)
TCO2: 24 mmol/L (ref 0–100)

## 2013-06-02 LAB — I-STAT TROPONIN, ED: Troponin i, poc: 0.02 ng/mL (ref 0.00–0.08)

## 2013-06-02 MED ORDER — GI COCKTAIL ~~LOC~~
30.0000 mL | Freq: Once | ORAL | Status: AC
  Administered 2013-06-02: 30 mL via ORAL
  Filled 2013-06-02: qty 30

## 2013-06-02 MED ORDER — LORAZEPAM 1 MG PO TABS
1.0000 mg | ORAL_TABLET | Freq: Once | ORAL | Status: AC
  Administered 2013-06-02: 1 mg via ORAL
  Filled 2013-06-02: qty 1

## 2013-06-02 MED ORDER — PROMETHAZINE HCL 25 MG PO TABS: 25.0000 mg | ORAL_TABLET | Freq: Four times a day (QID) | ORAL | Status: AC | PRN

## 2013-06-02 NOTE — ED Notes (Signed)
Pt states he had an anxiety attack today when he got to work about 230pm  Pt states he is having chest pain and had one episode of vomiting  Pt states he also has a headache and some shortness of breath

## 2013-06-02 NOTE — Discharge Instructions (Signed)
Your lab work and chest x-ray are negative. Tylenol or motrin for pain. Phenergan for nausea. Follow up with primary care doctor for recheck.    Emergency Department Resource Guide 1) Find a Doctor and Pay Out of Pocket Although you won't have to find out who is covered by your insurance plan, it is a good idea to ask around and get recommendations. You will then need to call the office and see if the doctor you have chosen will accept you as a new patient and what types of options they offer for patients who are self-pay. Some doctors offer discounts or will set up payment plans for their patients who do not have insurance, but you will need to ask so you aren't surprised when you get to your appointment.  2) Contact Your Local Health Department Not all health departments have doctors that can see patients for sick visits, but many do, so it is worth a call to see if yours does. If you don't know where your local health department is, you can check in your phone book. The CDC also has a tool to help you locate your state's health department, and many state websites also have listings of all of their local health departments.  3) Find a Walk-in Clinic If your illness is not likely to be very severe or complicated, you may want to try a walk in clinic. These are popping up all over the country in pharmacies, drugstores, and shopping centers. They're usually staffed by nurse practitioners or physician assistants that have been trained to treat common illnesses and complaints. They're usually fairly quick and inexpensive. However, if you have serious medical issues or chronic medical problems, these are probably not your best option.  No Primary Care Doctor: - Call Health Connect at  [phone] - they can help you locate a primary care doctor that  accepts your insurance, provides certain services, etc. - Physician Referral Service- [phone]  Chronic Pain Problems: Organization          Address  Phone   Notes  [facility]  [phone] Patients need to be referred by their primary care doctor.   Medication Assistance: Organization         Address  Phone   Notes  [facility] [address] [place], [place] [address] [phone]) [phone] --Must be a resident of [place] [facility] -- Must have NO insurance coverage whatsoever (no Medicaid/ Medicare, etc.) -- The pt. MUST have a primary care doctor that directs their care regularly and follows them in the community   MedAssist  [phone]   [employer]  [phone]    Agencies that provide inexpensive medical care: Organization         Address  Phone   Notes  [facility]  [phone]) [phone]   [facility]    [phone]   [facility] [address] [place], [place] [address] [phone]) [phone]   [facility] [address], [place] [phone]   [facility]    [phone](336) [phone]   [facility]    [phone](336) [phone]   [facility]  [address], [place] Phone:  [phone](336) [phone], Fax:  [phone](336) [phone] Hours of Operation:  9 am - 6 pm, M-F.  Also accepts Medicaid/Medicare and self-pay.  [facility]  [address], [address] [place]  Phone: [phone], Fax: [phone]. Hours of Operation:  8:30 am - 5:30 pm, M-F.  Also accepts Medicaid and self-pay.  °[facility] [place] [address], [place] Phone: [phone]   °[facility] [address], [place], [place] [phone], Ext. 123 Mondays & Thursdays: 7-9 AM.  First 15 patients are seen on a first come, first serve basis. °  ° °Medicaid-accepting [place] Providers: ° °Organization         Address  Phone   Notes  °[facility] [address], [place] [phone] Also accepts self-pay patients.  °[facility] [address], [place] ° [phone]) [phone]   °[facility] [address], [place] [phone]) [phone]   °[facility] [address] [place] [address], [place] [phone]) [phone]   °Veita Bland [address], [place]  ° [phone] Only accepts [place] Access Medicaid patients after they have their name applied to their card.  ° °Self-Pay (no insurance) in [place]: ° °Organization         Address  Phone   Notes  °Sickle Cell Patients, [facility] [address], [place] [phone]   °[facility] [address], [place] [phone]) [phone]   °[facility] ° [address], [place] [phone]) [phone]   °[facility]/Dr. Osei-Bonsu ° [address] [place] [address], [place] or [address], [place] [phone] Phone number for both [place] and [place] locations is the same.  °[facility] [address], [place] [phone]) [phone]   °[facility] [address] [place] [address], [place] or [address] [phone]) [phone] °([phone]) [phone]   °[facility] [address], [place] [phone]) [phone], phone; [phone], fax Sees patients 1st and 3rd Saturday of every month.  Must not qualify for public or private insurance (i.e. Medicaid, Medicare, [place] Health Choice, Veterans' Benefits) • Household income should be no more than 200% of the poverty level •The clinic cannot treat you if you are pregnant or think you are pregnant • Sexually transmitted diseases are not treated at the clinic.  ° ° °Dental Care: °Organization         Address  Phone  Notes  °[place] [facility] [address], [place] [phone]) [phone] Accepts children up to age 21 who are enrolled in Medicaid or [place] Health Choice; pregnant women with a Medicaid card; and  children who have applied for Medicaid or [place] Health Choice, but were declined, whose parents can pay a reduced fee at time of service.  °[place] [facility] [place]  [address], [place] [phone]) [phone] Accepts children up to age 21 who are enrolled in Medicaid or [place] Health Choice; pregnant women with a Medicaid card; and children who have applied for Medicaid or  Health Choice, but were declined, whose parents can pay a reduced fee at time of service.  °[facility] ° [address],  [phone]) [phone] Patients are seen by appointment only. Walk-ins are not accepted. [facility] will see patients 18 years of age and older. °Monday - Tuesday (8am-5pm) °Most Wednesdays (8:30-5pm) °$30 per visit, cash only  °[facility] ° [address], [facility] [phone] Patients are seen by appointment only. Walk-ins are not accepted. [place] will see patients 23 years of age and older. One Wednesday Evening (Monthly: Volunteer Based).  $30 per visit, cash only  [place]  [phone] for adults; Children under age 68, call Graduate Pediatric Dentistry at [phone]. Children aged 23-14, please call [phone] to request a pediatric application.  Dental services are provided in all areas of dental care including fillings, crowns and bridges, complete and partial dentures, implants, gum treatment, root canals, and extractions. Preventive care is also provided. Treatment is provided to both adults and children. Patients are selected via a lottery and there is often a waiting list.   [facility] [address] [place]  [phone] [email or web address]   [facility] [address] [place], [place] [phone], Ext. [phone] Second and Fourth Thursday of each month, opens at 6:30 AM; Clinic ends at 9 AM.  Patients are seen on a first-come first-served  basis, and a limited number are seen during each clinic.   [facility]  [address] Hillard Danker [place], [place] [phone]   Eligibility Requirements You must have lived in [place], [place], or [place] counties for at least the last three months.   You cannot be eligible for state or federal sponsored [employer], including [employer], [place], or [employer].   You generally cannot be eligible for healthcare insurance through your employer.    How to apply: Eligibility screenings are held every Tuesday and Wednesday afternoon from 1:00 pm until 4:00 pm. You do not need an appointment for the interview!  [facility] [address], [place], [place]   [place] Department  [place]  [phone]    Behavioral Health Resources in the Community: Intensive Outpatient Programs Organization         Address  Phone  Notes  [facility] [place]. [address], [place], [place] [phone]   [facility] [address] [place], [place]   [facility] [address] [place], [place]   [place] [address]  [place], [place] or [phone]   Substance Abuse Resources Organization         Address  Phone  Notes  [facility]  [phone]) [phone]   [place]  [phone]   [facility] [place]   Chinita [place]ester  [phone]   Residential & Outpatient Substance Abuse Program  [phone]   Psychological Services Organization         Address  Phone  Notes  [facility] [place]  [place]  [phone]   [place] [address], [place] or [phone]    Mobile Crisis Teams Organization          Address  Phone  Notes  [facility], Mobile Crisis Care Unit  [phone]   [facility]  [address] [employer], [place]   [place] [address] [place] [phone]    Self-Help/Support Groups Organization         Address  Phone             Notes  [place] of [place] - variety of  support groups  [phone]- [phone] Call for more information  Narcotics Anonymous (NA), [facility] [address], [employer] [place]  2 meetings at this location   Residential Sports administrator         Address  Phone  Notes  [facility] [address],    [place]  [phone]   [facility]  [address] [place] [address], [place], [place] [phone]   [facility] [address] [place], [place] [phone] Admissions: 8am-3pm M-F  [facility] [address],    [place], [place] [phone]   [facility] [address] [place], [place], [place] [phone]   [facility] [address]  [place], [place] [phone]   [facility] - Intensive Outpatient [address], [place], [place] [phone]   [facility] [address] [place].,  [place], [place] [phone] or [phone]   [facility] ([facility]) [address] [place], [place] [phone] Accepts Medicaid  [facility] [address],  [place] [phone] Substance Abuse/Addiction Treatment   [facility] Organization         Address  Phone  Notes  [facility]  [phone]   Angie Fava, PhD [address] Ervin Knack [place], [place]   [phone] or [phone]   [facility]   [address] [place], [place] [phone]   [facility] [address] [place], [place] [phone] Insurance/Medicaid/sponsorship  through [facility] and Families [address]                                    [place], [place] [phone] Therapy/tele-psych/case  [facility] [address], [place] [phone]    Dr. Lolly Mustache  [phone]   [facility]  United Way [facility] Dept. 1) [address] [place] 2) [address], [place] 3)  [address], [place] [phone]  [phone]   [facility] Child Abuse Hotline 438-723-4477 or [phone]) [phone] (After Hours)

## 2013-06-02 NOTE — ED Provider Notes (Signed)
CSN: [id number]     Arrival date & time 06/02/13  1955 History   First MD Initiated Contact with Patient 06/02/13 2014     Chief Complaint  Patient presents with  . Anxiety  . Chest Pain     (Consider location/radiation/quality/duration/timing/severity/associated sxs/prior Treatment) HPI Keith Crane is a 33 y.o. male who presents to emergency department with complaint of episode of a panic attack, which was followed by chest tightness, nausea, vomiting, dizziness. Patient states episode occurred at 2 PM. States chest pain persistent since then. States associated shortness of breath, vomited once. States that also has been belching since then. Reports history of panic attacks. States he is supposed to be on anxiety medication but he has not been able to followup with his doctor has been off of it for several months. He does not remember the name of the medication. He denies any upper respiratory symptoms besides cough, states history of asthma. Cough is dry nonproductive. He denies any sore throat or congestion. He denies any recent travel or surgeries. He denies any lower extremity edema. No calf pain. He did not take any medications prior to coming in. No prior cardiac history.  Past Medical History  Diagnosis Date  . Anxiety   . Migraine   . Hypertension     has been borderline.  . Asthma     childhood  . History of kidney stones     first episode 12-08-12  . 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 Cystoscopy w/ ureteral stent placement Bilateral 12/08/2012    Procedure: CYSTOSCOPY WITH RETROGRADE PYELOGRAM/URETERAL STENT PLACEMENT, left diagnostic ureteroscopy;  Surgeon: Sebastian Ache, MD;  Location: [facility];  Service: Urology;  Laterality: Bilateral;  . Cystoscopy with retrograde pyelogram, ureteroscopy and stent placement Bilateral 01/01/2013    Procedure: CYSTOSCOPY WITH RETROGRADE PYELOGRAM,  URETEROSCOPY AND STENT PLACEMENT   WITH STENT CHANGE;  Surgeon: Sebastian Ache, MD;  Location: [facility];  Service: Urology;  Laterality: Bilateral;  . Holmium laser application Bilateral 01/01/2013    Procedure: HOLMIUM LASER APPLICATION;  Surgeon: Sebastian Ache, MD;  Location: [facility];  Service: Urology;  Laterality: Bilateral;   History reviewed. No pertinent family history. History  Substance Use Topics  . Smoking status: Current Every Day Smoker -- 0.25 packs/day for 3 years    Types: Cigarettes  . Smokeless tobacco: Not on file  . Alcohol Use: Yes     Comment: social-weekends    Review of Systems  Constitutional: Negative for fever and chills.  Respiratory: Positive for chest tightness and shortness of breath. Negative for cough.   Cardiovascular: Positive for chest pain. Negative for palpitations and leg swelling.  Gastrointestinal: Positive for nausea and vomiting. Negative for abdominal pain, diarrhea and abdominal distention.  Genitourinary: Negative for dysuria, urgency, frequency and hematuria.  Musculoskeletal: Negative for arthralgias, myalgias, neck pain and neck stiffness.  Skin: Negative for rash.  Allergic/Immunologic: Negative for immunocompromised state.  Neurological: Positive for headaches. Negative for dizziness, weakness, light-headedness and numbness.      Allergies  Penicillins  Home Medications  No current outpatient prescriptions on file. BP 143/66  Pulse 71  Temp(Src) 100 F (37.8 C) (Oral)  Resp 16  SpO2 97% Physical Exam  Nursing note and vitals reviewed. Constitutional: He is oriented to person, place, and time. He appears well-developed and well-nourished. No distress.  HENT:  Head: Normocephalic and atraumatic.  Right Ear:  External ear normal.  Left Ear: External ear normal.  Mouth/Throat: Oropharynx is clear and moist.  Eyes: Conjunctivae are normal.  Neck: Normal range of motion. Neck supple.  No meningeal signs  Cardiovascular:  Normal rate, regular rhythm and normal heart sounds.   Pulmonary/Chest: Effort normal and breath sounds normal. No respiratory distress. He has no wheezes. He has no rales. He exhibits no tenderness.  Abdominal: Soft. Bowel sounds are normal. There is no tenderness.  Musculoskeletal: He exhibits no edema and no tenderness.  Lymphadenopathy:    He has no cervical adenopathy.  Neurological: He is alert and oriented to person, place, and time.  Skin: Skin is warm and dry. No erythema.  Psychiatric: He has a normal mood and affect.    ED Course  Procedures (including critical care time) Labs Review Labs Reviewed  I-STAT CHEM 8, ED - Abnormal; Notable for the following:    Potassium 3.5 (*)    Glucose, Bld 103 (*)    All other components within normal limits  CBC WITH DIFFERENTIAL  Rosezena SensorI-STAT TROPOININ, ED   Imaging Review Dg Chest 2 View  06/02/2013   CLINICAL DATA:  Chest pain  EXAM: CHEST  2 VIEW  COMPARISON:  None.  FINDINGS: Lungs are clear. Heart size and pulmonary vascularity are normal. No adenopathy. No pneumothorax. No bone lesions.  IMPRESSION: No abnormality noted.   Electronically Signed   By: Bretta BangWilliam  Woodruff M.D.   On: 06/02/2013 21:00     EKG Interpretation   Date/Time:  Monday June 02 2013 20:06:29 EDT Ventricular Rate:  63 PR Interval:  166 QRS Duration: 82 QT Interval:  366 QTC Calculation: 375 R Axis:   41 Text Interpretation:  Sinus rhythm Atrial premature complexes in couplets  Low voltage, precordial leads Confirmed by WARD,  DO, KRISTEN ([id number]) on  06/02/2013 10:25:37 PM      MDM   Final diagnoses:  Chest pain  Anxiety    Patient here with a panic attack, no chest pain, anxiety, belching, one episode of vomiting earlier. His exam is unremarkable. His abdomen is benign. chest x-ray, lab 1 troponin. Patient in the chest is now for 6 hours and is constant. We'll try GI cocktail, Ativan.  10:24 PM CXR negative. Labs unremarkable. PERC negative, do not  think this is PE. He is low rink for cardiac disease, Troponin and ECG negative. WIll d/c home with close outpatient follow up.   Filed Vitals:   06/02/13 2007  BP: 143/66  Pulse: 71  Temp: 100 F (37.8 C)  TempSrc: Oral  Resp: 16  SpO2: 97%     Lottie Musselatyana A Fenna Semel, PA-C 06/02/13 2227

## 2013-06-03 NOTE — ED Provider Notes (Signed)
Medical screening examination/treatment/procedure(s) were performed by non-physician practitioner and as supervising physician I was immediately available for consultation/collaboration.   EKG Interpretation   Date/Time:  Monday June 02 2013 20:06:29 EDT Ventricular Rate:  63 PR Interval:  166 QRS Duration: 82 QT Interval:  366 QTC Calculation: 375 R Axis:   41 Text Interpretation:  Sinus rhythm Atrial premature complexes in couplets  Low voltage, precordial leads Confirmed by Marica Trentham,  DO, Rakim Moone ([id number]) on  06/02/2013 10:25:37 PM        Keith MawKristen N Alaisha Eversley, DO 06/03/13 [id number]

## 2014-10-29 IMAGING — RF DG RETROGRADE PYELOGRAM
1 series · 8 of 8 positions shown · non-contrast
Comparison: CT OF THE ABDOMEN AND PELVIS EARLIER TODAY.

CLINICAL DATA: Obstructing left UPJ calculus.

EXAM:
INTRAOPERATIVE BILATERAL RETROGRADE UROGRAPHY
TECHNIQUE: Images were obtained with the C-arm fluoroscopic device
intraoperatively and submitted for interpretation post-operatively.
Please see the procedural report for the amount of contrast and the
fluoroscopy time utilized.

[Series 1: run · 8 of 8 slices shown]
[im 1/8]
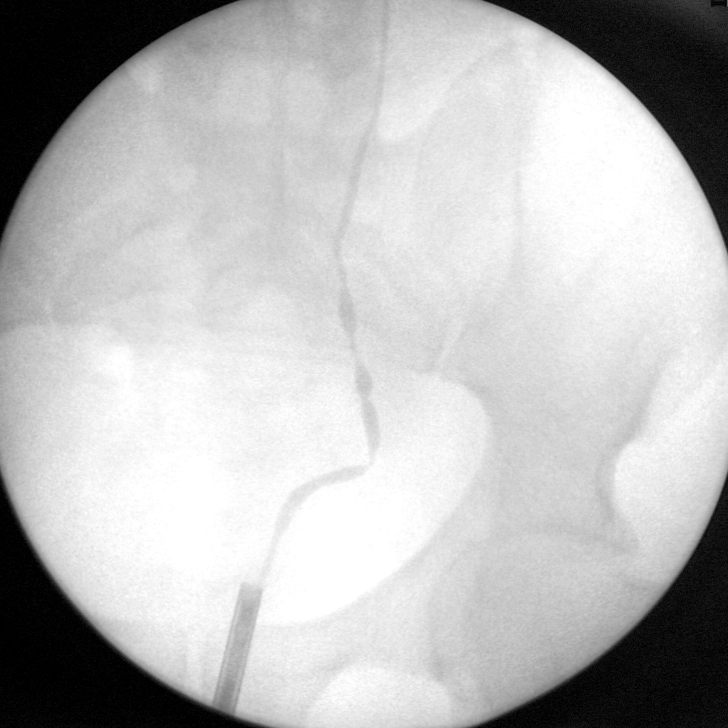
[im 2/8]
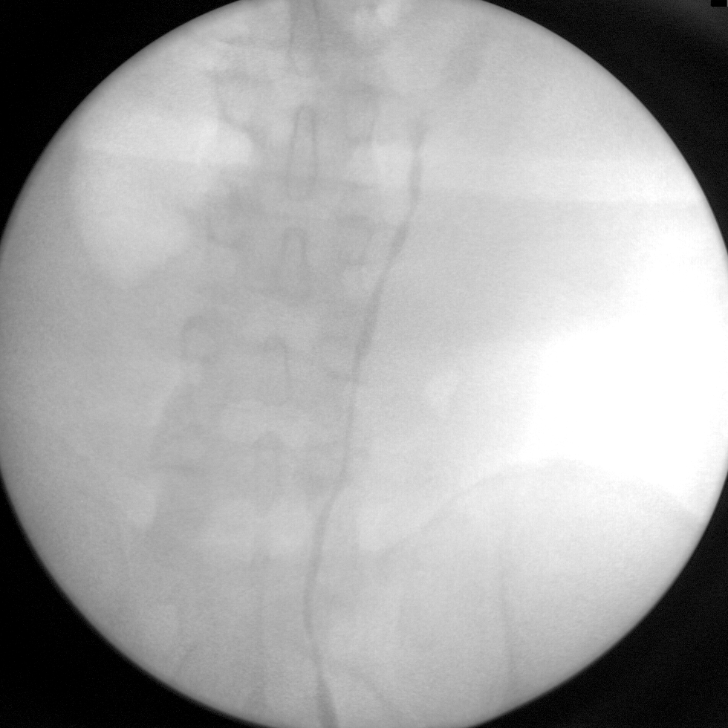
[im 3/8]
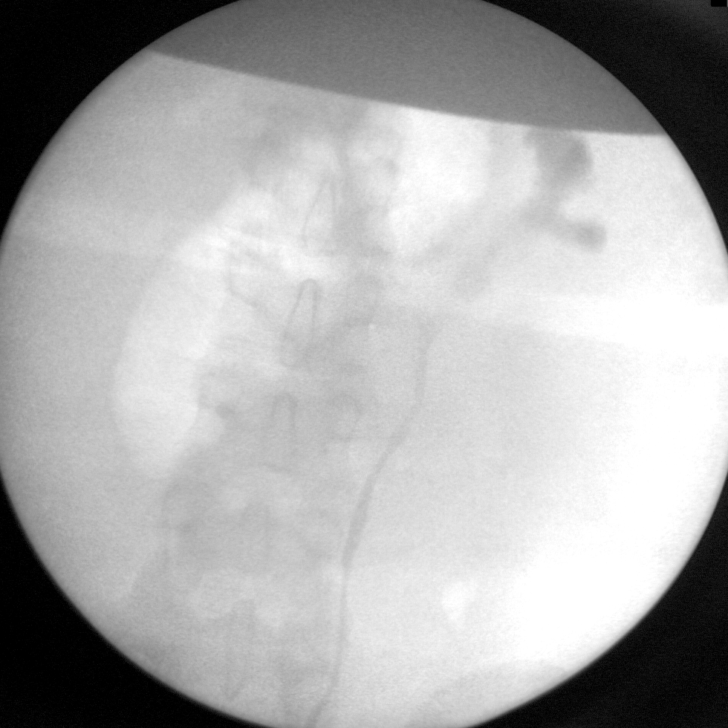
[im 4/8]
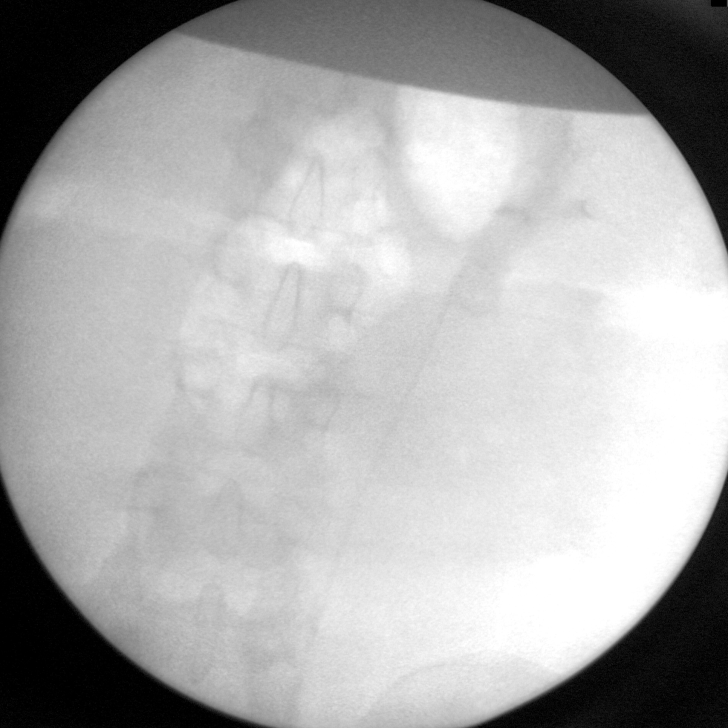
[im 5/8]
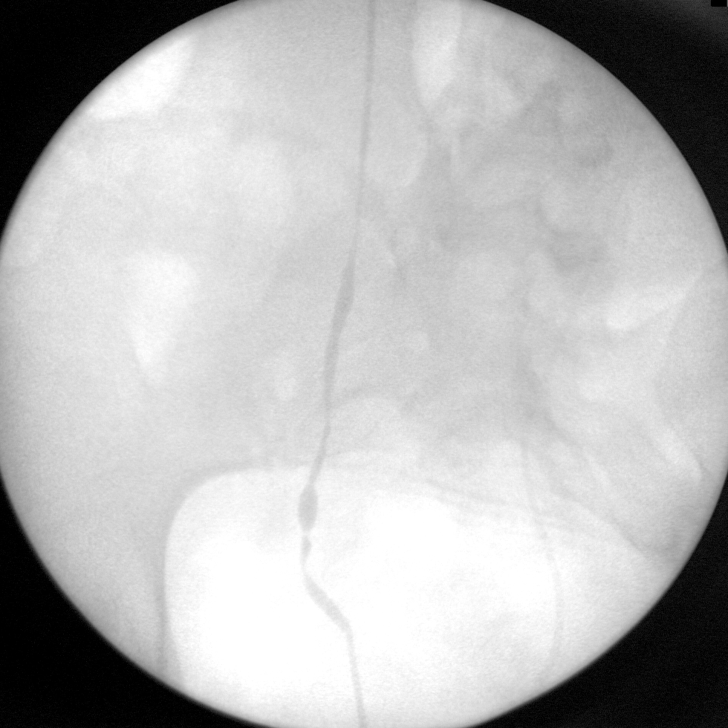
[im 6/8]
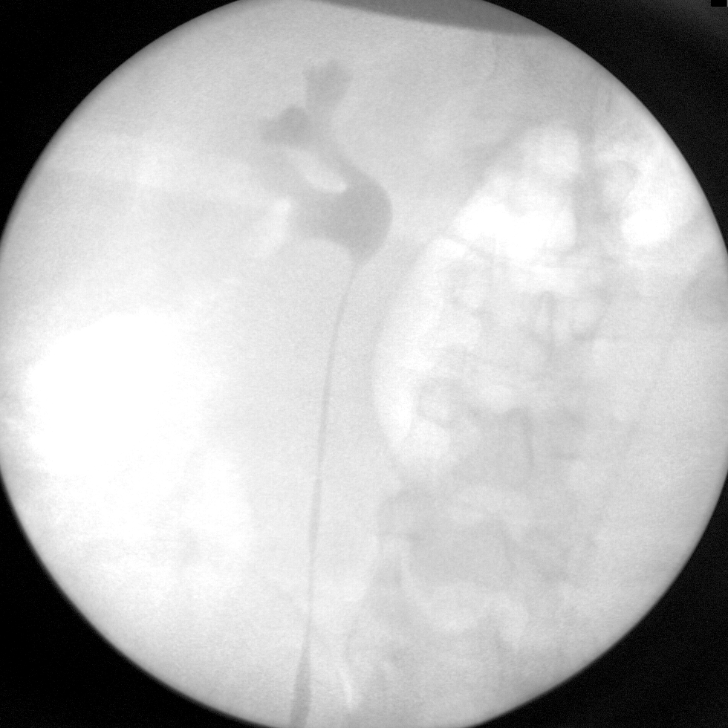
[im 7/8]
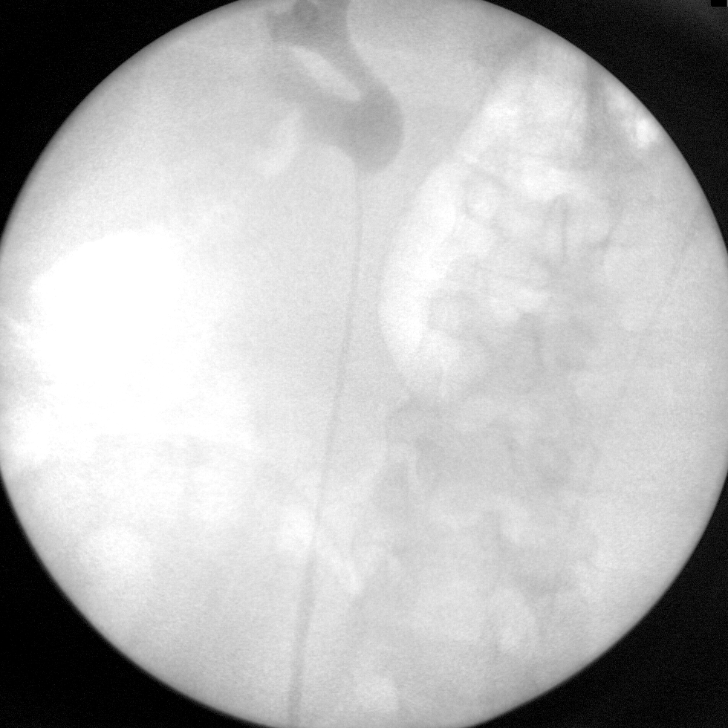
[im 8/8]
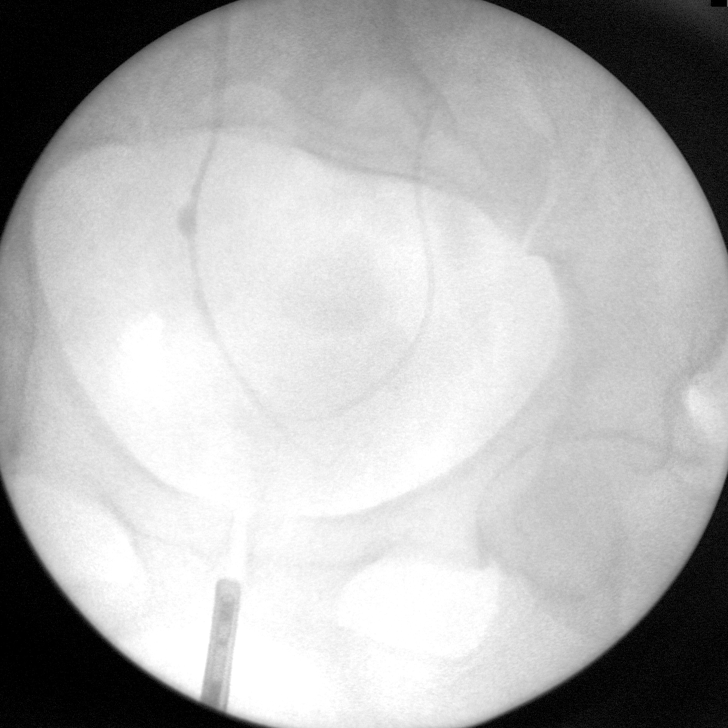

[8 of 8 positions shown; findings below may reference images not displayed]

FINDINGS: Intraoperative imaging shows cannulation of the left ureter via a
cystoscope with contrast injection demonstrating normal patency of
the visualized ureter. A ureteral stent was placed which is
appropriately positioned extending from the renal pelvis into the
bladder. There was limited opacification of the left renal
collecting system.

Retrograde study was also performed on the right demonstrating
normal patency of the right ureter and normal appearance of the
right renal collecting system. A ureteral stent was also placed on
the right.
IMPRESSION: Imaging during urologic procedure with placement of bilateral
ureteral stents.

## 2014-10-29 IMAGING — CT CT ABD-PELV W/O CM
1 series · 16 of 29 positions shown, 20 images · non-contrast
Comparison: None.

CLINICAL DATA: Left-sided flank pain, abdominal pain, back pain,
nausea and vomiting.

EXAM:
CT ABDOMEN AND PELVIS WITHOUT
TECHNIQUE: Multidetector CT imaging of the chest, abdomen and pelvis was
performed following the standard protocol without IV contrast.

[Series 4: lung · axial · 0.98mm/px · z∈[-186,-56]mm · 16 of 29 slices shown, 20 images]
[im 2/29  soft-tissue]
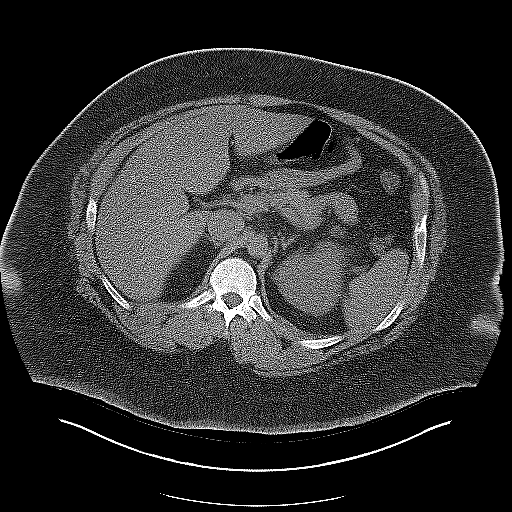
[im 2/29  bone]
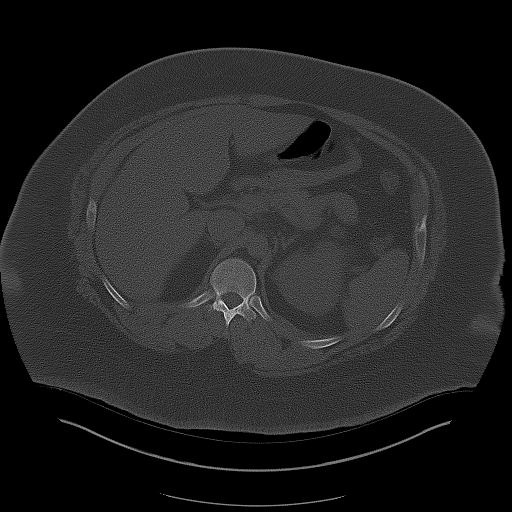
[im 4/29  soft-tissue]
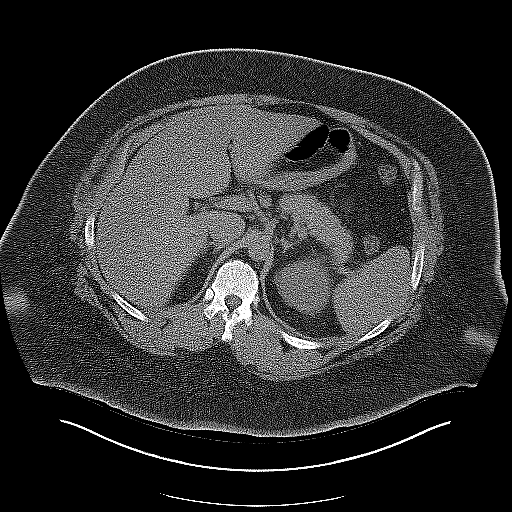
[im 6/29  soft-tissue]
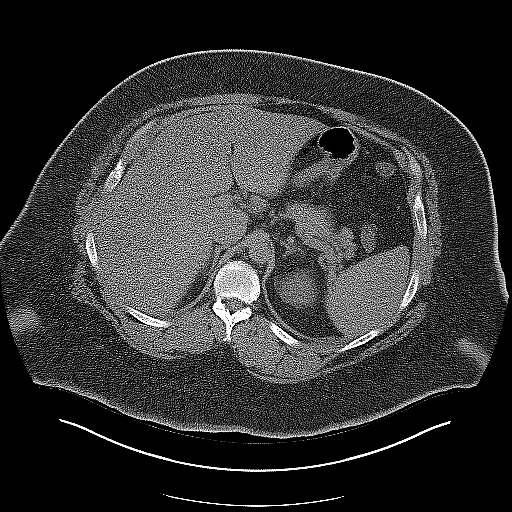
[im 8/29  soft-tissue]
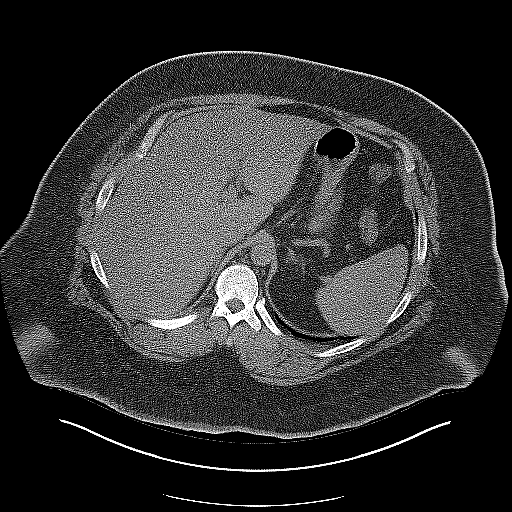
[im 10/29  soft-tissue]
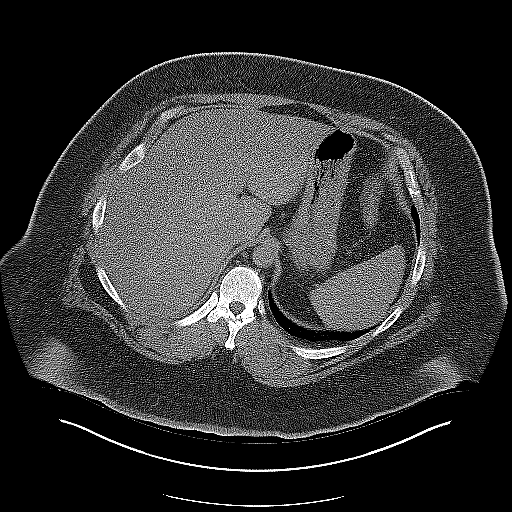
[im 12/29  soft-tissue]
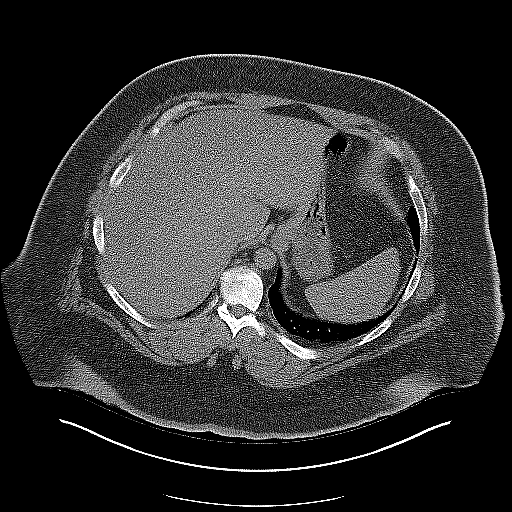
[im 14/29  soft-tissue]
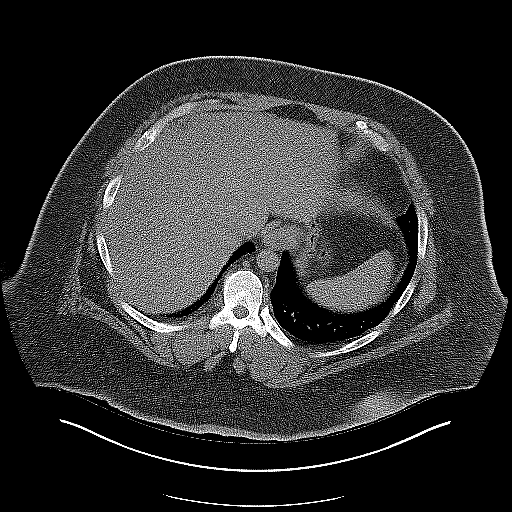
[im 16/29  soft-tissue]
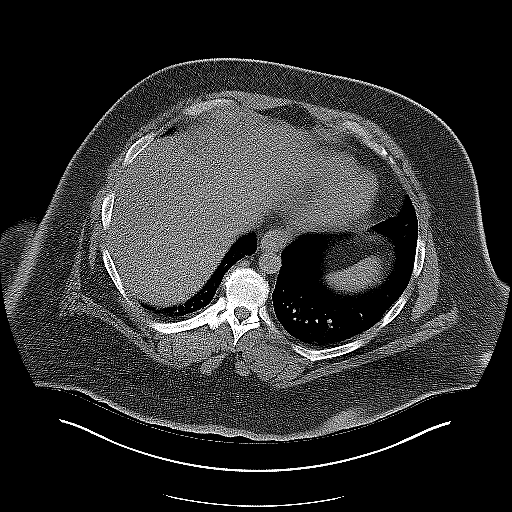
[im 18/29  soft-tissue]
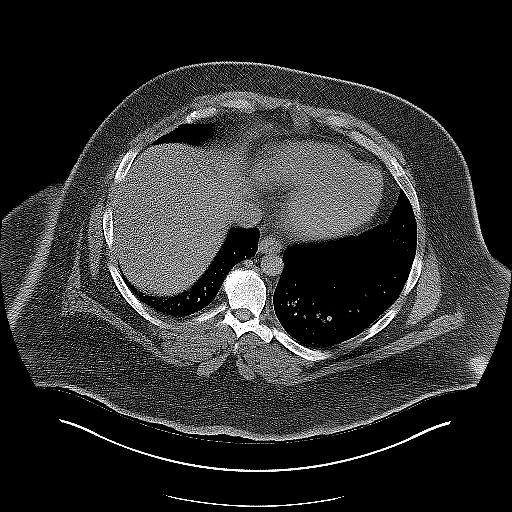
[im 18/29  bone]
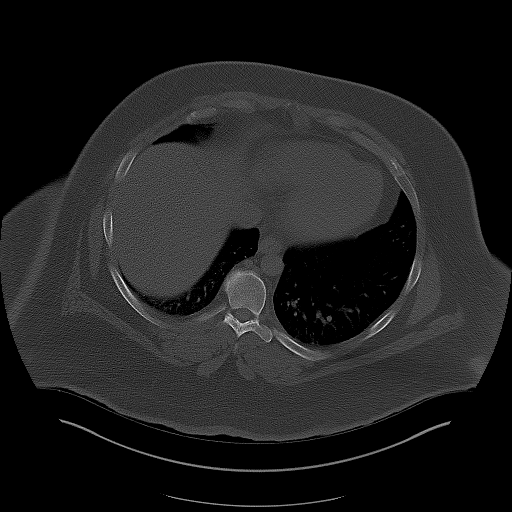
[im 20/29  soft-tissue]
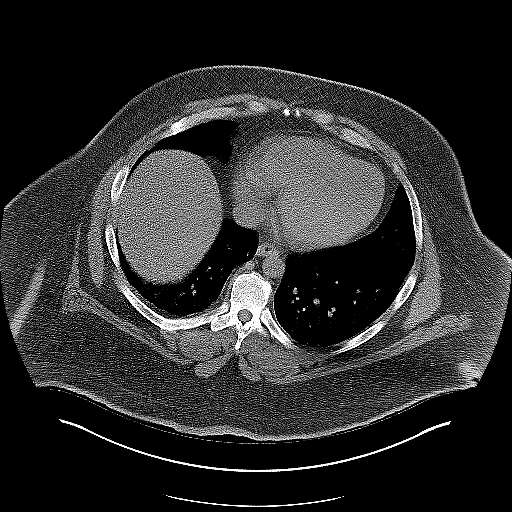
[im 22/29  soft-tissue]
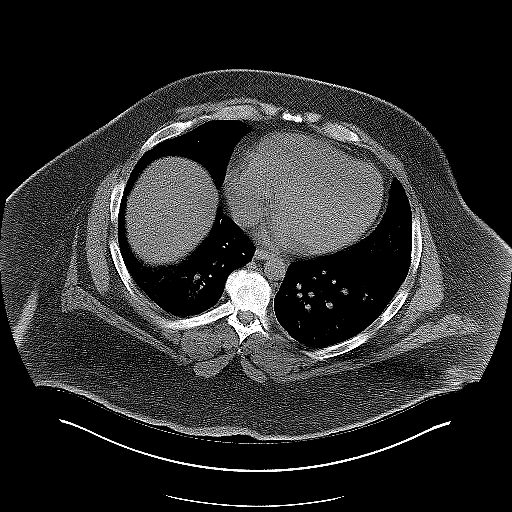
[im 24/29  soft-tissue]
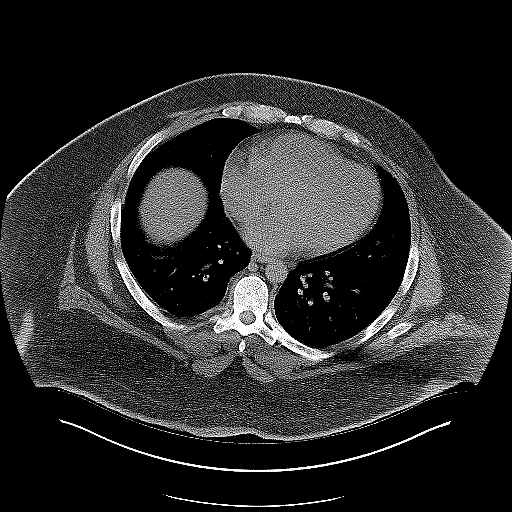
[im 25/29  lung]
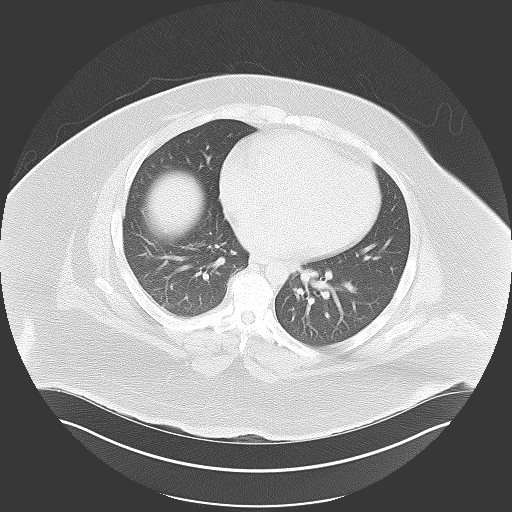
[im 26/29  soft-tissue]
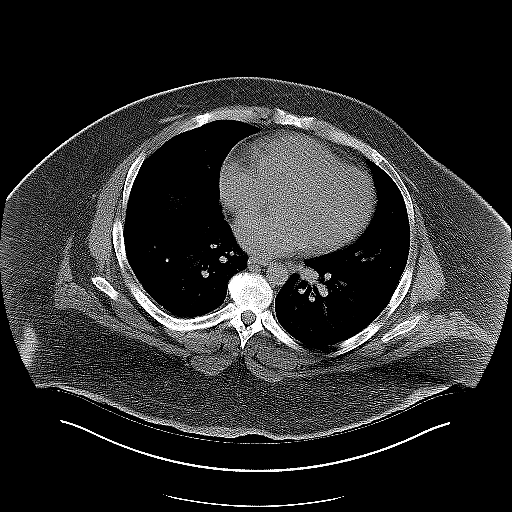
[im 26/29  lung]
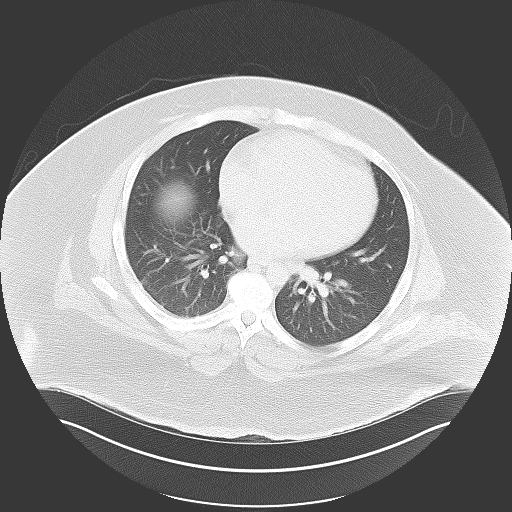
[im 27/29  lung]
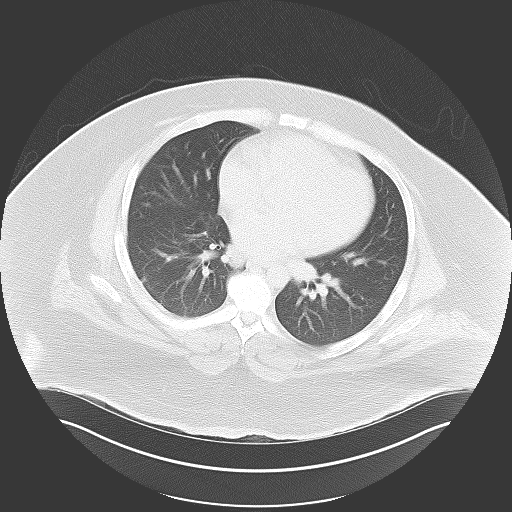
[im 28/29  soft-tissue]
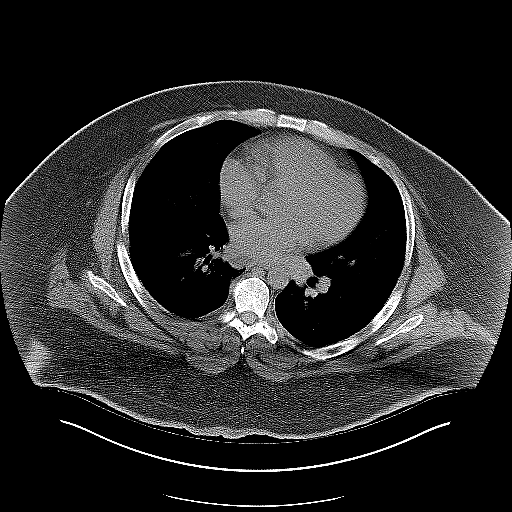
[im 28/29  lung]
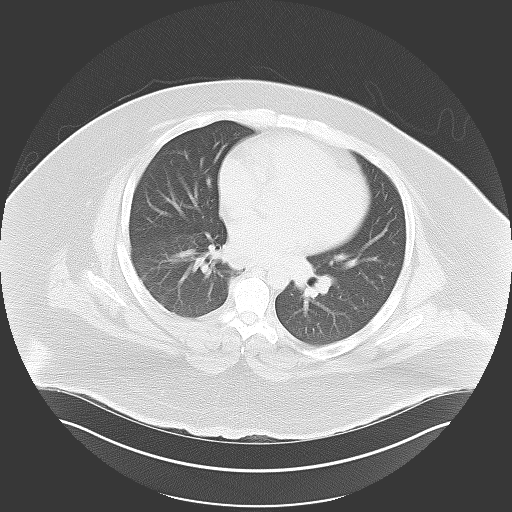

[16 of 29 positions shown; findings below may reference images not displayed]

FINDINGS: There is evidence of moderate left hydronephrosis secondary to a
calculus at the left ureteropelvic junction. The calculus measures
proximally 10 mm in greatest diameter. No other left-sided calculi
are identified. There is a 3 mm nonobstructing calculus in the lower
pole of the right kidney. The bladder is decompressed and
unremarkable in appearance.

Unenhanced appearance of the liver, gallbladder, pancreas, spleen,
adrenal glands and bowel are unremarkable. No abnormal fluid
collections are identified. No hernias are seen. No incidental
masses or enlarged lymph nodes. Bony structures are unremarkable.
IMPRESSION: Left hydronephrosis secondary to an obstructing 10 mm calculus
located at the ureteropelvic junction.

## 2014-11-01 IMAGING — CR DG ABDOMEN 1V
2 series · 2 of 2 positions shown · non-contrast
Comparison: Abdominal radiograph performed 10/16/2011, and CT of
the abdomen and pelvis performed 12/08/2012

CLINICAL DATA: Abdominal pain and nausea.  Known renal stones.

ABDOMEN - 1 VIEW

[t abdomen supine (1 of 2)]
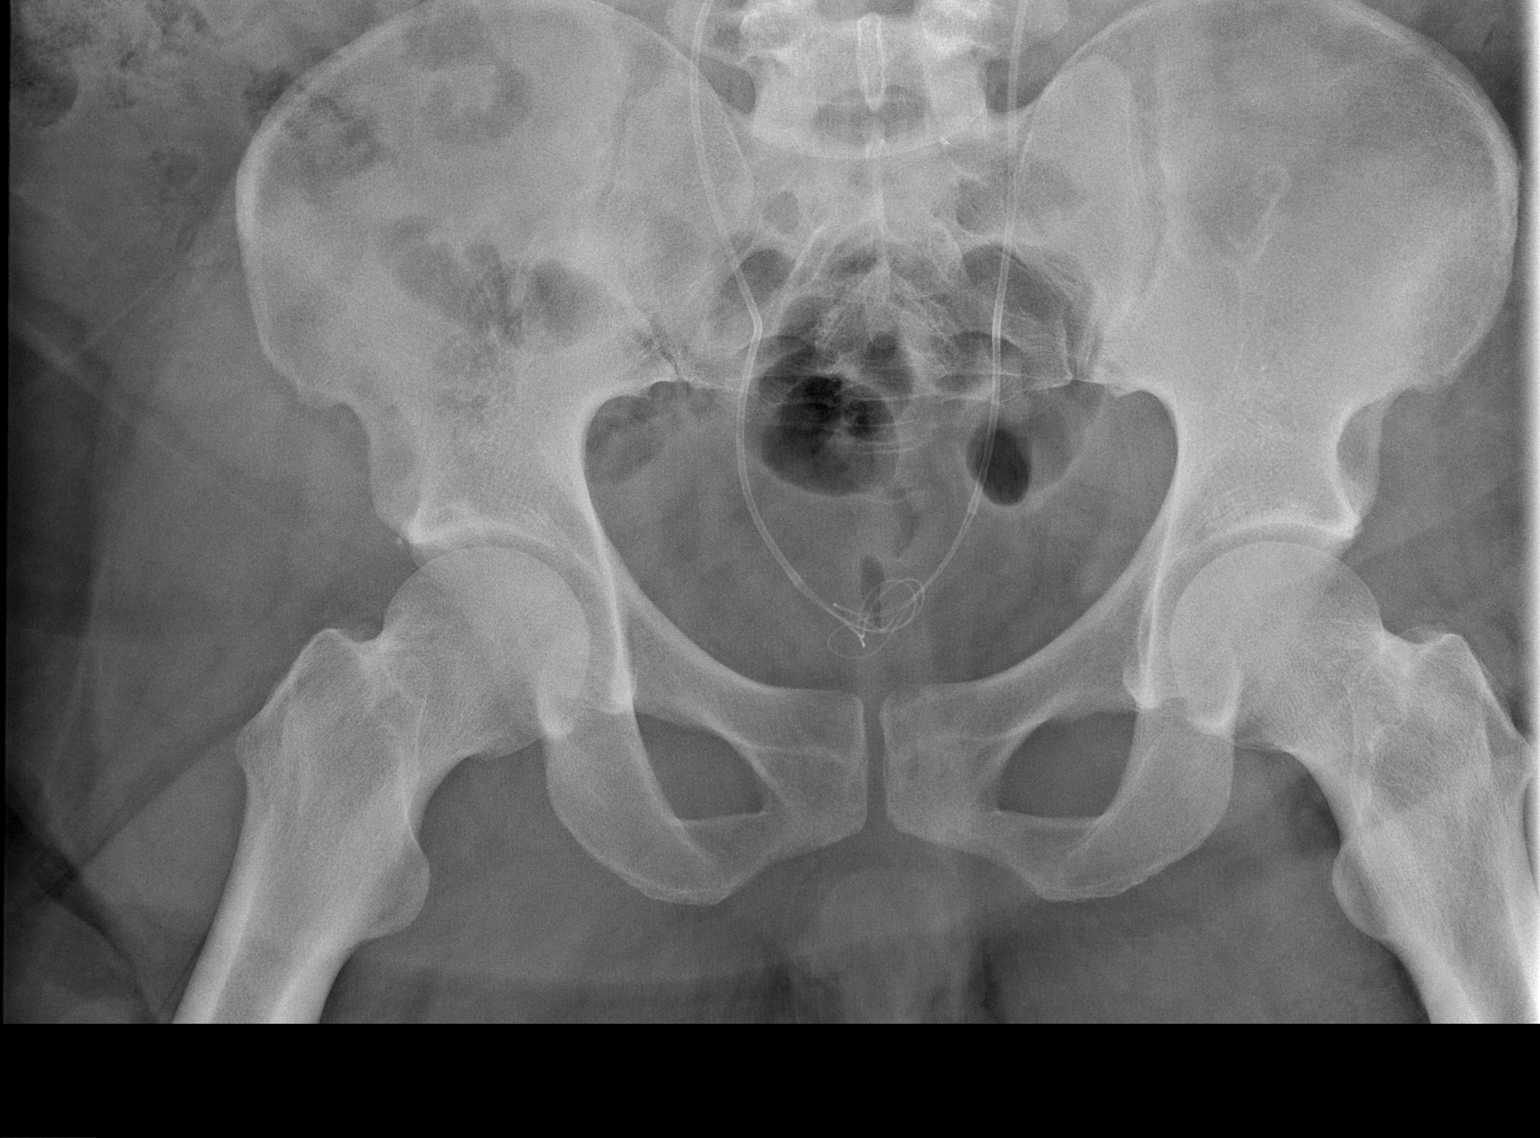

[t abdomen supine (2 of 2)]
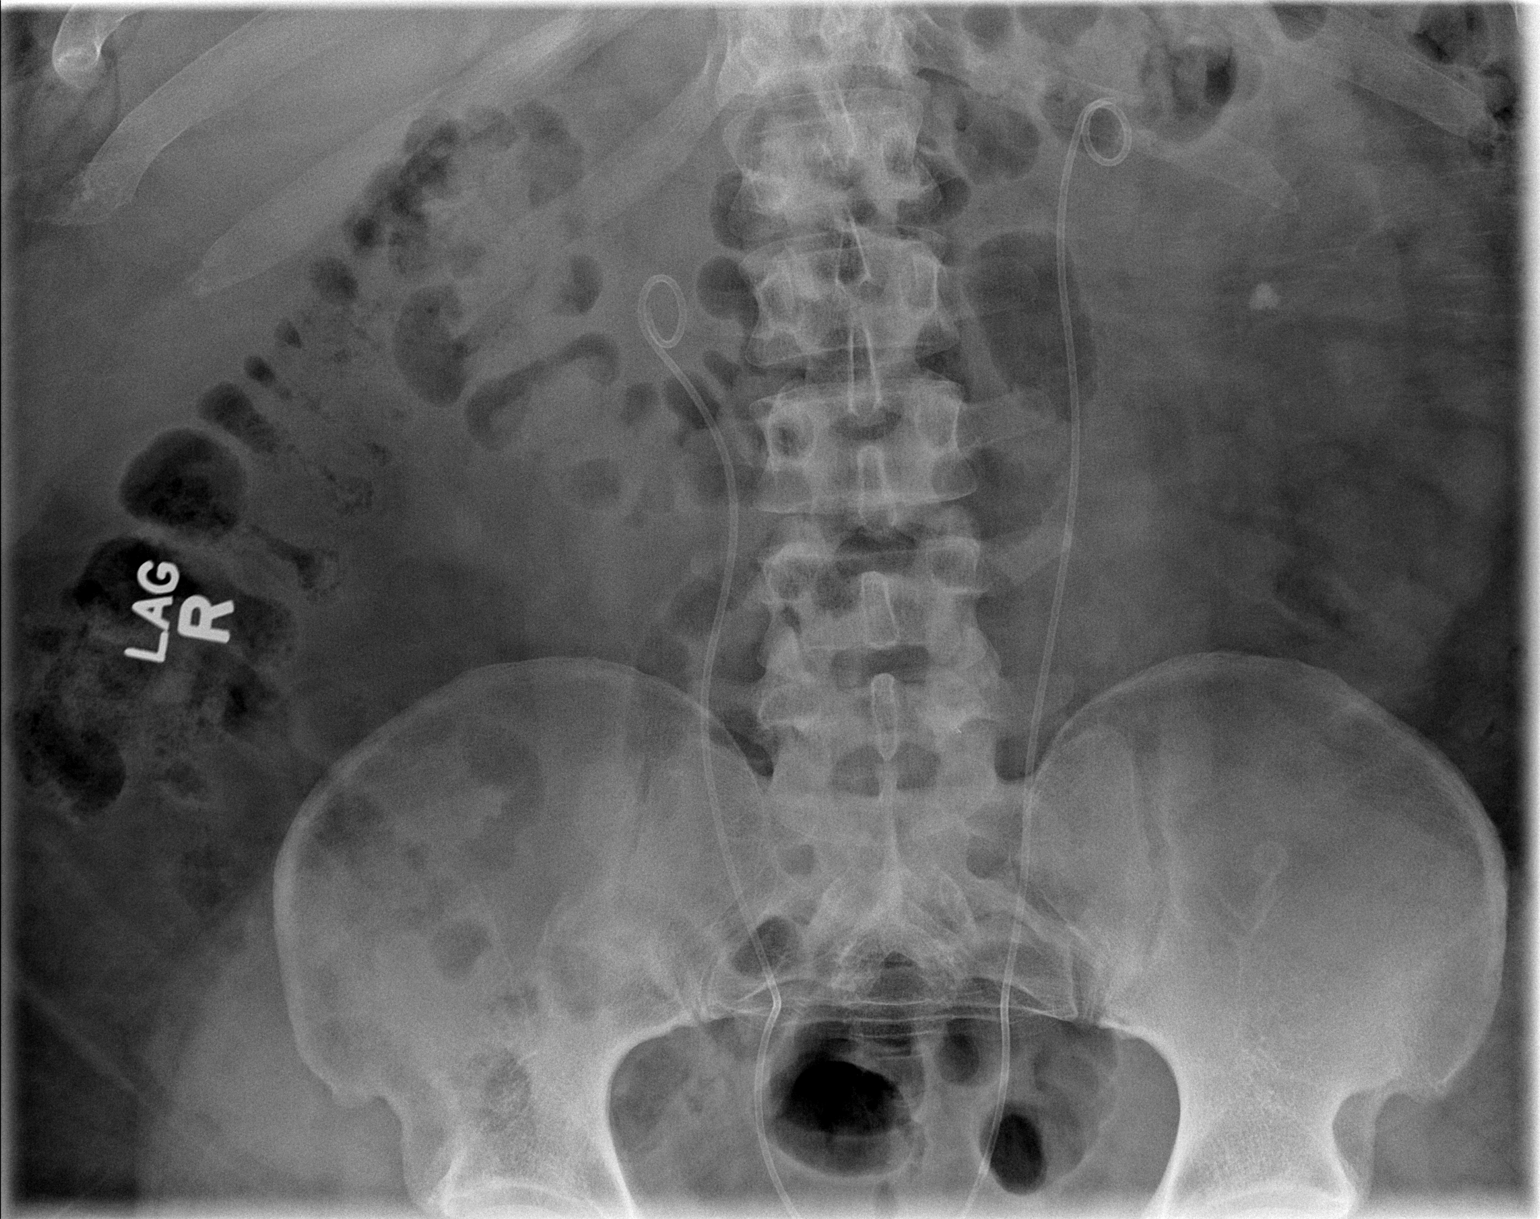

[2 of 2 positions shown; findings below may reference images not displayed]

FINDINGS: Bilateral ureteral stents are noted in expected position.
A 1.0 cm stone is noted at the lower pole of the left kidney; this
may have fallen back into the left renal calyces, status post
ureteral stent placement.  The known right-sided renal stone is not
definitely characterized on radiograph.

The visualized bowel gas pattern is unremarkable.  Scattered air
and stool filled loops of colon are seen; no abnormal dilatation of
small bowel loops is seen to suggest small bowel obstruction.  No
free intra-abdominal air is identified, though evaluation for free
air is limited on a single supine view.

The visualized osseous structures are within normal limits; the
sacroiliac joints are unremarkable in appearance.
IMPRESSION: 1.  Bilateral ureteral stents noted in expected position.  1.0 cm
stone noted at the lower pole of the left kidney; this may have
fallen back into the left renal calyces, status post ureteral stent
placement.
2.  Unremarkable bowel gas pattern; no free intra-abdominal air
seen.
